# Patient Record
Sex: Female | Born: 1985 | Race: White | Hispanic: No | Marital: Married | Smoking: Never smoker
Health system: Southern US, Community
[De-identification: ages and names within clinical notes are randomized; demographics above are authoritative.]

## PROBLEM LIST (undated history)

## (undated) DIAGNOSIS — Z789 Other specified health status: Secondary | ICD-10-CM

## (undated) HISTORY — PX: CHOLECYSTECTOMY: SHX55

## (undated) HISTORY — PX: TONSILLECTOMY: SUR1361

---

## 2017-07-30 ENCOUNTER — Encounter: Payer: Self-pay | Admitting: Emergency Medicine

## 2017-07-30 ENCOUNTER — Ambulatory Visit (INDEPENDENT_AMBULATORY_CARE_PROVIDER_SITE_OTHER): Payer: BC Managed Care – PPO

## 2017-07-30 ENCOUNTER — Inpatient Hospital Stay
Admission: EM | Admit: 2017-07-30 | Discharge: 2017-08-03 | DRG: 871 | Disposition: A | Discharge status: Home / Self Care | Payer: BC Managed Care – PPO | Attending: Internal Medicine | Admitting: Internal Medicine

## 2017-07-30 ENCOUNTER — Ambulatory Visit (INDEPENDENT_AMBULATORY_CARE_PROVIDER_SITE_OTHER)
Admission: EM | Admit: 2017-07-30 | Discharge: 2017-07-30 | Disposition: A | Discharge status: Other Acute Inpatient Hospital | Payer: BC Managed Care – PPO | Source: Home / Self Care | Attending: Family Medicine | Admitting: Family Medicine

## 2017-07-30 DIAGNOSIS — Z836 Family history of other diseases of the respiratory system: Secondary | ICD-10-CM

## 2017-07-30 DIAGNOSIS — Z8709 Personal history of other diseases of the respiratory system: Secondary | ICD-10-CM

## 2017-07-30 DIAGNOSIS — J181 Lobar pneumonia, unspecified organism: Secondary | ICD-10-CM | POA: Diagnosis present

## 2017-07-30 DIAGNOSIS — J9601 Acute respiratory failure with hypoxia: Secondary | ICD-10-CM | POA: Diagnosis present

## 2017-07-30 DIAGNOSIS — A419 Sepsis, unspecified organism: Principal | ICD-10-CM

## 2017-07-30 DIAGNOSIS — Z9049 Acquired absence of other specified parts of digestive tract: Secondary | ICD-10-CM | POA: Diagnosis not present

## 2017-07-30 DIAGNOSIS — J189 Pneumonia, unspecified organism: Secondary | ICD-10-CM

## 2017-07-30 DIAGNOSIS — R05 Cough: Secondary | ICD-10-CM | POA: Diagnosis not present

## 2017-07-30 DIAGNOSIS — R0789 Other chest pain: Secondary | ICD-10-CM

## 2017-07-30 DIAGNOSIS — R6520 Severe sepsis without septic shock: Secondary | ICD-10-CM | POA: Diagnosis present

## 2017-07-30 DIAGNOSIS — E872 Acidosis: Secondary | ICD-10-CM | POA: Diagnosis present

## 2017-07-30 DIAGNOSIS — E876 Hypokalemia: Secondary | ICD-10-CM | POA: Diagnosis present

## 2017-07-30 HISTORY — DX: Other specified health status: Z78.9

## 2017-07-30 LAB — CBC WITH DIFFERENTIAL/PLATELET
BASOS ABS: 0 10*3/uL (ref 0–0.1)
Basophils Absolute: 0 10*3/uL (ref 0–0.1)
Basophils Relative: 0 %
Basophils Relative: 0 %
Eosinophils Absolute: 0 10*3/uL (ref 0–0.7)
Eosinophils Absolute: 0 10*3/uL (ref 0–0.7)
Eosinophils Relative: 0 %
Eosinophils Relative: 0 %
HEMATOCRIT: 39.8 % (ref 35.0–47.0)
HEMATOCRIT: 37.1 % (ref 35.0–47.0)
HEMOGLOBIN: 13.2 g/dL (ref 12.0–16.0)
HEMOGLOBIN: 12.7 g/dL (ref 12.0–16.0)
LYMPHS PCT: 2 %
LYMPHS PCT: 3 %
Lymphs Abs: 0.3 10*3/uL — ABNORMAL LOW (ref 1.0–3.6)
Lymphs Abs: 0.5 10*3/uL — ABNORMAL LOW (ref 1.0–3.6)
MCH: 29 pg (ref 26.0–34.0)
MCH: 30 pg (ref 26.0–34.0)
MCHC: 33.1 g/dL (ref 32.0–36.0)
MCHC: 34.3 g/dL (ref 32.0–36.0)
MCV: 87.8 fL (ref 80.0–100.0)
MCV: 87.5 fL (ref 80.0–100.0)
MONO ABS: 0.5 10*3/uL (ref 0.2–0.9)
MONO ABS: 0.6 10*3/uL (ref 0.2–0.9)
MONOS PCT: 3 %
Monocytes Relative: 3 %
NEUTROS ABS: 17.8 10*3/uL — AB (ref 1.4–6.5)
NEUTROS ABS: 18.4 10*3/uL — AB (ref 1.4–6.5)
NEUTROS PCT: 95 %
NEUTROS PCT: 94 %
Platelets: 219 10*3/uL (ref 150–440)
Platelets: 215 10*3/uL (ref 150–440)
RBC: 4.53 MIL/uL (ref 3.80–5.20)
RBC: 4.24 MIL/uL (ref 3.80–5.20)
RDW: 12.9 % (ref 11.5–14.5)
RDW: 13.3 % (ref 11.5–14.5)
WBC: 18.7 10*3/uL — AB (ref 3.6–11.0)
WBC: 19.5 10*3/uL — ABNORMAL HIGH (ref 3.6–11.0)

## 2017-07-30 LAB — BASIC METABOLIC PANEL
ANION GAP: 11 (ref 5–15)
ANION GAP: 11 (ref 5–15)
BUN: 13 mg/dL (ref 6–20)
BUN: 9 mg/dL (ref 6–20)
CALCIUM: 8.5 mg/dL — AB (ref 8.9–10.3)
CHLORIDE: 100 mmol/L — AB (ref 101–111)
CO2: 22 mmol/L (ref 22–32)
CO2: 21 mmol/L — ABNORMAL LOW (ref 22–32)
CREATININE: 0.74 mg/dL (ref 0.44–1.00)
Calcium: 9 mg/dL (ref 8.9–10.3)
Chloride: 104 mmol/L (ref 101–111)
Creatinine, Ser: 0.7 mg/dL (ref 0.44–1.00)
GFR calc non Af Amer: >60 mL/min (ref >60)
GFR calc non Af Amer: >60 mL/min (ref >60)
GLUCOSE: 151 mg/dL — AB (ref 65–99)
Glucose, Bld: 158 mg/dL — ABNORMAL HIGH (ref 65–99)
POTASSIUM: 3.9 mmol/L (ref 3.5–5.1)
Potassium: 2.8 mmol/L — ABNORMAL LOW (ref 3.5–5.1)
SODIUM: 136 mmol/L (ref 135–145)
Sodium: 133 mmol/L — ABNORMAL LOW (ref 135–145)

## 2017-07-30 LAB — MAGNESIUM: Magnesium: 1.7 mg/dL (ref 1.7–2.4)

## 2017-07-30 LAB — STREP PNEUMONIAE URINARY ANTIGEN: STREP PNEUMO URINARY ANTIGEN: NEGATIVE

## 2017-07-30 LAB — LACTIC ACID, PLASMA
LACTIC ACID, VENOUS: 4.2 mmol/L — AB (ref 0.5–1.9)
LACTIC ACID, VENOUS: 2.9 mmol/L — AB (ref 0.5–1.9)
Lactic Acid, Venous: 2.3 mmol/L (ref 0.5–1.9)

## 2017-07-30 MED ORDER — BENZONATATE 100 MG PO CAPS: 100.0000 mg | ORAL_CAPSULE | Freq: Three times a day (TID) | ORAL | 0 refills | Status: DC | PRN

## 2017-07-30 MED ORDER — POTASSIUM CHLORIDE IN NACL 40-0.9 MEQ/L-% IV SOLN
INTRAVENOUS | Status: AC
  Administered 2017-07-30: 125 mL/h via INTRAVENOUS
  Filled 2017-07-30: qty 1000

## 2017-07-30 MED ORDER — ONDANSETRON HCL 4 MG/2ML IJ SOLN
4.0000 mg | Freq: Once | INTRAMUSCULAR | Status: AC
  Administered 2017-07-30: 4 mg via INTRAVENOUS
  Filled 2017-07-30: qty 2

## 2017-07-30 MED ORDER — BENZONATATE 100 MG PO CAPS
100.0000 mg | ORAL_CAPSULE | Freq: Three times a day (TID) | ORAL | Status: DC | PRN
  Administered 2017-07-31 – 2017-08-03 (×8): 100 mg via ORAL
  Filled 2017-07-30 (×8): qty 1

## 2017-07-30 MED ORDER — CEFTRIAXONE SODIUM IN DEXTROSE 20 MG/ML IV SOLN
1.0000 g | Freq: Once | INTRAVENOUS | Status: AC
  Administered 2017-07-30: 1 g via INTRAVENOUS
  Filled 2017-07-30: qty 50

## 2017-07-30 MED ORDER — DOCUSATE SODIUM 100 MG PO CAPS: 100.0000 mg | ORAL_CAPSULE | Freq: Two times a day (BID) | ORAL | Status: DC | PRN

## 2017-07-30 MED ORDER — AZITHROMYCIN 500 MG PO TABS
500.0000 mg | ORAL_TABLET | Freq: Once | ORAL | Status: AC
  Administered 2017-07-30: 500 mg via ORAL

## 2017-07-30 MED ORDER — PROMETHAZINE HCL 25 MG/ML IJ SOLN
25.0000 mg | Freq: Four times a day (QID) | INTRAMUSCULAR | Status: DC | PRN
  Administered 2017-07-30: 25 mg via INTRAVENOUS
  Filled 2017-07-30: qty 1

## 2017-07-30 MED ORDER — SODIUM CHLORIDE 0.9 % IV BOLUS (SEPSIS)
1000.0000 mL | Freq: Once | INTRAVENOUS | Status: AC
  Administered 2017-07-30: 1000 mL via INTRAVENOUS

## 2017-07-30 MED ORDER — CEFTRIAXONE SODIUM IN DEXTROSE 20 MG/ML IV SOLN
1.0000 g | INTRAVENOUS | Status: DC
  Filled 2017-07-30: qty 50

## 2017-07-30 MED ORDER — IBUPROFEN 400 MG PO TABS
200.0000 mg | ORAL_TABLET | Freq: Four times a day (QID) | ORAL | Status: DC | PRN
  Administered 2017-07-30 – 2017-08-01 (×3): 200 mg via ORAL
  Filled 2017-07-30 (×3): qty 1

## 2017-07-30 MED ORDER — AZITHROMYCIN 250 MG PO TABS: ORAL_TABLET | ORAL | 0 refills | Status: DC

## 2017-07-30 MED ORDER — POTASSIUM CHLORIDE 20 MEQ PO PACK
40.0000 meq | PACK | Freq: Once | ORAL | Status: AC
  Administered 2017-07-30: 40 meq via ORAL
  Filled 2017-07-30: qty 2

## 2017-07-30 MED ORDER — IPRATROPIUM-ALBUTEROL 0.5-2.5 (3) MG/3ML IN SOLN
3.0000 mL | Freq: Once | RESPIRATORY_TRACT | Status: AC
  Administered 2017-07-30: 3 mL via RESPIRATORY_TRACT

## 2017-07-30 MED ORDER — ACETAMINOPHEN 325 MG PO TABS
650.0000 mg | ORAL_TABLET | Freq: Four times a day (QID) | ORAL | Status: DC | PRN
  Administered 2017-07-30 – 2017-08-02 (×3): 650 mg via ORAL
  Filled 2017-07-30 (×3): qty 2

## 2017-07-30 MED ORDER — ACETAMINOPHEN 500 MG PO TABS
1000.0000 mg | ORAL_TABLET | Freq: Once | ORAL | Status: AC
  Administered 2017-07-30: 1000 mg via ORAL

## 2017-07-30 MED ORDER — GUAIFENESIN-CODEINE 100-10 MG/5ML PO SOLN
10.0000 mL | Freq: Four times a day (QID) | ORAL | Status: DC | PRN
  Administered 2017-08-01 – 2017-08-03 (×7): 10 mL via ORAL
  Filled 2017-07-30 (×7): qty 10

## 2017-07-30 MED ORDER — CEFTRIAXONE SODIUM 1 G IJ SOLR
1.0000 g | Freq: Once | INTRAMUSCULAR | Status: AC
  Administered 2017-07-30: 1 g via INTRAMUSCULAR

## 2017-07-30 MED ORDER — POTASSIUM CHLORIDE CRYS ER 20 MEQ PO TBCR
40.0000 meq | EXTENDED_RELEASE_TABLET | Freq: Two times a day (BID) | ORAL | Status: DC
  Filled 2017-07-30: qty 2

## 2017-07-30 MED ORDER — SODIUM CHLORIDE 0.9 % IV SOLN
INTRAVENOUS | Status: DC
  Administered 2017-07-30: 16:00:00 via INTRAVENOUS

## 2017-07-30 MED ORDER — DEXTROSE 5 % IV SOLN
1.0000 g | INTRAVENOUS | Status: DC
  Administered 2017-07-31 – 2017-08-03 (×4): 1 g via INTRAVENOUS
  Filled 2017-07-30 (×4): qty 10

## 2017-07-30 MED ORDER — DEXTROSE 5 % IV SOLN
250.0000 mg | INTRAVENOUS | Status: DC
  Administered 2017-07-31 – 2017-08-01 (×2): 250 mg via INTRAVENOUS
  Filled 2017-07-30 (×3): qty 250

## 2017-07-30 NOTE — Progress Notes (Signed)
Pt is unable to swallow PO Klor Con, MD Vachhani notified. MD placing orders.

## 2017-07-30 NOTE — Progress Notes (Signed)
ADMISSION NOTE:  Pt arrived to room 146 from ED. Pt alert and oriented X4. Skin assessment completed and tele monitor applied. Navigator completed. Pt oriented to room and staff. Pts HR elevated (MD Vachanni aware).  Pts bed in lowest position and call bell in reach.

## 2017-07-30 NOTE — Progress Notes (Signed)
Received report from ER nurse, Pts potasium 2.8. MD Esaw Grandchild notified. MD placing orders.

## 2017-07-30 NOTE — ED Triage Notes (Signed)
Patient c/o cough, chest congestion, and chest tightness since Monday.

## 2017-07-30 NOTE — ED Triage Notes (Signed)
Seen at [facility] for cough, diagnosed with pneumonia and sent here.
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
## 2017-07-30 NOTE — Discharge Instructions (Signed)
Recommend go to Emergency Department

## 2017-07-30 NOTE — H&P (Addendum)
[facility] - [place] at [facility]   PATIENT NAME: Amanda Torres    MR#:  [id number]  DATE OF BIRTH:  1986-02-18  DATE OF ADMISSION:  07/30/2017  PRIMARY CARE PHYSICIAN: Titus Mould, NP   REQUESTING/REFERRING PHYSICIAN: Quale  CHIEF COMPLAINT:   Chief Complaint  Patient presents with  . Shortness of Breath    HISTORY OF PRESENT ILLNESS: Amanda Torres  is a 31 y.o. 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 Given as admission due to sepsis.  PAST MEDICAL HISTORY:   Past Medical History:  Diagnosis Date  . Medical history non-contributory     PAST SURGICAL HISTORY: Past Surgical History:  Procedure Laterality Date  . CHOLECYSTECTOMY    . TONSILLECTOMY      SOCIAL HISTORY:  Social History  Substance Use Topics  . Smoking status: Never Smoker  . Smokeless tobacco: Never Used  . Alcohol use No    FAMILY HISTORY:  Family History  Problem Relation Age of Onset  . Lung disease Mother     DRUG ALLERGIES: No Known Allergies  REVIEW OF SYSTEMS:   CONSTITUTIONAL: No fever, positive for fatigue or weakness.  EYES: No blurred or double vision.  EARS, NOSE, AND THROAT: No tinnitus or ear pain.  RESPIRATORY: positive for cough, shortness of breath,no wheezing or hemoptysis.  CARDIOVASCULAR: positive for chest pain,no orthopnea, edema.  GASTROINTESTINAL: No nausea, vomiting, diarrhea or abdominal pain.  GENITOURINARY: No dysuria, hematuria.  ENDOCRINE: No polyuria, nocturia,  HEMATOLOGY: No anemia, easy bruising or bleeding SKIN: No rash or lesion. MUSCULOSKELETAL: No joint pain or arthritis.   NEUROLOGIC: No tingling, numbness, weakness.  PSYCHIATRY: No anxiety or depression.   MEDICATIONS AT HOME:  Prior to Admission  medications   Medication Sig Start Date End Date Taking? Authorizing Provider  acetaminophen (TYLENOL) 325 MG tablet Take 650 mg by mouth every 6 (six) hours as needed.   Yes [provider]  azithromycin (ZITHROMAX Z-PAK) 250 MG tablet 2 tabs po once day 1, then 1 tab po qd for next 4 days 07/30/17  Yes Payton Mccallum, MD  ibuprofen (ADVIL,MOTRIN) 100 MG tablet Take 100 mg by mouth every 6 (six) hours as needed for fever.   Yes [provider]  benzonatate (TESSALON) 100 MG capsule Take 1 capsule (100 mg total) by mouth 3 (three) times daily as needed. Patient not taking: Reported on 07/30/2017 07/30/17   Payton Mccallum, MD      PHYSICAL EXAMINATION:   VITAL SIGNS: Blood pressure (!) 121/42, pulse (!) 125, temperature 98.7 F (37.1 C), temperature source Oral, resp. rate (!) 25, height  (1.549 m), weight 52.6 kg (116 lb), last menstrual period 07/07/2017, SpO2 96 %.  GENERAL:  31 y.o.-year-old patient lying in the bed with no acute distress.  EYES: Pupils equal, round, reactive to light and accommodation. No scleral icterus. Extraocular muscles intact.  HEENT: Head atraumatic, normocephalic. Oropharynx and nasopharynx clear.  NECK:  Supple, no jugular venous distention. No thyroid enlargement, no tenderness.  LUNGS: Normal breath sounds bilaterally, no wheezing, have crepitation. No use of accessory muscles of respiration.  CARDIOVASCULAR: S1, S2 normal and fast, No murmurs, rubs, or gallops.  ABDOMEN: Soft, nontender, nondistended. Bowel sounds present. No organomegaly or mass.  EXTREMITIES: No pedal edema,  cyanosis, or clubbing.  NEUROLOGIC: Cranial nerves II through XII are intact. Muscle strength 5/5 in all extremities. Sensation intact. Gait not checked.  PSYCHIATRIC: The patient is alert and oriented x 3.  SKIN: No obvious rash, lesion, or ulcer.   LABORATORY PANEL:   CBC  Recent Labs Lab 07/30/17 0840 07/30/17 1312  WBC 18.7* 19.5*  HGB 13.2 12.7  HCT  39.8 37.1  PLT 219 215  MCV 87.8 87.5  MCH 29.0 30.0  MCHC 33.1 34.3  RDW 12.9 13.3  LYMPHSABS 0.3* 0.5*  MONOABS 0.5 0.6  EOSABS 0.0 0.0  BASOSABS 0.0 0.0   ------------------------------------------------------------------------------------------------------------------  Chemistries   Recent Labs Lab 07/30/17 0840 07/30/17 1312  NA 133* 136  K 3.9 2.8*  CL 100* 104  CO2 22 21*  GLUCOSE 151* 158*  BUN 13 9  CREATININE 0.70 0.74  CALCIUM 9.0 8.5*   ------------------------------------------------------------------------------------------------------------------ estimated creatinine clearance is 76.9 mL/min (by C-G formula based on SCr of 0.74 mg/dL). ------------------------------------------------------------------------------------------------------------------ No results for input(s): TSH, T4TOTAL, T3FREE, THYROIDAB in the last 72 hours.  Invalid input(s): FREET3   Coagulation profile No results for input(s): INR, PROTIME in the last 168 hours. ------------------------------------------------------------------------------------------------------------------- No results for input(s): DDIMER in the last 72 hours. -------------------------------------------------------------------------------------------------------------------  Cardiac Enzymes No results for input(s): CKMB, TROPONINI, MYOGLOBIN in the last 168 hours.  Invalid input(s): CK ------------------------------------------------------------------------------------------------------------------ Invalid input(s): POCBNP  ---------------------------------------------------------------------------------------------------------------  Urinalysis No results found for: COLORURINE, APPEARANCEUR, LABSPEC, PHURINE, GLUCOSEU, HGBUR, BILIRUBINUR, KETONESUR, PROTEINUR, UROBILINOGEN, NITRITE, LEUKOCYTESUR   RADIOLOGY: Dg Chest 2 View  Result Date: 07/30/2017 CLINICAL DATA:  Nonproductive cough, congestion, fever  and shortness of breath over the last week. EXAM: CHEST  2 VIEW COMPARISON:  None. FINDINGS: Heart size is normal. Mediastinal shadows are normal. There is left lower lobe pneumonia. There may be mild patchy pneumonia at the right lung base. Linear density in the right mid lung probably represents scar. Follow-up on subsequent radiographs. IMPRESSION: Lower lobe pneumonia left worse than right. Electronically Signed   By: Paulina Fusi M.D.   On: 07/30/2017 09:04    EKG: No orders found for this or any previous visit.  IMPRESSION AND PLAN:  * Sepsis    Pneumonia   Lactic acidosis    IV rocephin, IV azithromycin   Sputum and Blood cx   IV fluids   Lactic acid to follow.  * hypokalemia   Replace oral, check Mg.  All the records are reviewed and case discussed with ED provider. Management plans discussed with the patient, family and they are in agreement.  CODE STATUS: full. Code Status History    This patient does not have a recorded code status. Please follow your organizational policy for patients in this situation.       TOTAL TIME TAKING CARE OF THIS PATIENT: 45 minutes.    Altamese Dilling M.D on 07/30/2017   Between 7am to 6pm - Pager - [phone]  After 6pm go to [email or web address] - Social research officer, government  [facility]  Office  [phone]  CC: Primary care physician; White, Arlyss Repress, NP   Note: This dictation was prepared with Dragon dictation along with smaller phrase technology. Any transcriptional errors that result from this process are unintentional.

## 2017-07-30 NOTE — ED Provider Notes (Signed)
[facility]    CSN: [id number] Arrival date & time: 07/30/17  7829     History   Chief Complaint Chief Complaint  Patient presents with  . Cough    HPI Dilana Hink is a 31 y.o. female.   The history is provided by the patient.  Cough  Associated symptoms: fever   Associated symptoms: no headaches and no wheezing   URI  Presenting symptoms: cough and fever   Severity:  Moderate Onset quality:  Sudden Duration:  6 days Timing:  Constant Progression:  Worsening Chronicity:  New Relieved by:  Nothing Ineffective treatments:  OTC medications Associated symptoms: no headaches and no wheezing   Risk factors: not elderly, no chronic cardiac disease, no chronic kidney disease, no chronic respiratory disease, no diabetes mellitus, no immunosuppression, no recent illness, no recent travel and no sick contacts     History reviewed. No pertinent past medical history.  There are no active problems to display for this patient.   Past Surgical History:  Procedure Laterality Date  . CHOLECYSTECTOMY    . TONSILLECTOMY      OB History    No data available       Home Medications    Prior to Admission medications   Medication Sig Start Date End Date Taking? Authorizing Provider  azithromycin (ZITHROMAX Z-PAK) 250 MG tablet 2 tabs po once day 1, then 1 tab po qd for next 4 days 07/30/17   Payton Mccallum, MD  benzonatate (TESSALON) 100 MG capsule Take 1 capsule (100 mg total) by mouth 3 (three) times daily as needed. 07/30/17   Payton Mccallum, MD    Family History History reviewed. No pertinent family history.  Social History Social History  Substance Use Topics  . Smoking status: Never Smoker  . Smokeless tobacco: Never Used  . Alcohol use No     Allergies   Patient has no known allergies.   Review of Systems Review of Systems  Constitutional: Positive for fever.  Respiratory: Positive for cough. Negative for wheezing.   Neurological:  Negative for headaches.     Physical Exam Triage Vital Signs ED Triage Vitals  Enc Vitals Group     BP 07/30/17 0835 104/62     Pulse Rate 07/30/17 0835 (S) (!) 130     Resp 07/30/17 0835 17     Temp 07/30/17 0835 (S) (!) 103 F (39.4 C)     Temp Source 07/30/17 0835 Oral     SpO2 07/30/17 0835 (S) 94 %     Weight 07/30/17 0833 160 lb (72.6 kg)     Height 07/30/17 0833  (1.549 m)     Head Circumference --      Peak Flow --      Pain Score 07/30/17 0833 0     Pain Loc --      Pain Edu? --      Excl. in GC? --    No data found.   Updated Vital Signs BP (!) 104/51 (BP Location: Right Arm)   Pulse (!) 120   Temp 99.5 F (37.5 C) (Oral)   Resp 16   Ht  (1.549 m)   Wt 160 lb (72.6 kg)   LMP 07/07/2017 (Exact Date) Comment: denies preg  SpO2 93%   BMI 30.23 kg/m   Visual Acuity Right Eye Distance:   Left Eye Distance:   Bilateral Distance:    Right Eye Near:   Left Eye Near:    Bilateral  Near:     Physical Exam  Constitutional: She appears well-developed and well-nourished. No distress.  HENT:  Head: Normocephalic and atraumatic.  Right Ear: External ear normal.  Left Ear: External ear normal.  Nose: No mucosal edema, rhinorrhea, nose lacerations, sinus tenderness, nasal deformity, septal deviation or nasal septal hematoma. No epistaxis.  No foreign bodies. Right sinus exhibits no maxillary sinus tenderness and no frontal sinus tenderness. Left sinus exhibits no maxillary sinus tenderness and no frontal sinus tenderness.  Mouth/Throat: Uvula is midline, oropharynx is clear and moist and mucous membranes are normal. No oropharyngeal exudate.  Eyes: Pupils are equal, round, and reactive to light. Conjunctivae and EOM are normal. Right eye exhibits no discharge. Left eye exhibits no discharge. No scleral icterus.  Neck: Normal range of motion. Neck supple. No thyromegaly present.  Cardiovascular: Normal rate, regular rhythm and normal heart sounds.     Pulmonary/Chest: Effort normal and breath sounds normal. No respiratory distress. She has no wheezes. She has no rales.  Lymphadenopathy:    She has no cervical adenopathy.  Skin: She is not diaphoretic.  Nursing note and vitals reviewed.    UC Treatments / Results  Labs (all labs ordered are listed, but only abnormal results are displayed) Labs Reviewed  CBC WITH DIFFERENTIAL/PLATELET - Abnormal; Notable for the following:       Result Value   WBC 18.7 (*)    Neutro Abs 17.8 (*)    Lymphs Abs 0.3 (*)    All other components within normal limits  BASIC METABOLIC PANEL - Abnormal; Notable for the following:    Sodium 133 (*)    Chloride 100 (*)    Glucose, Bld 151 (*)    All other components within normal limits    EKG  EKG Interpretation None       Radiology Dg Chest 2 View  Result Date: 07/30/2017 CLINICAL DATA:  Nonproductive cough, congestion, fever and shortness of breath over the last week. EXAM: CHEST  2 VIEW COMPARISON:  None. FINDINGS: Heart size is normal. Mediastinal shadows are normal. There is left lower lobe pneumonia. There may be mild patchy pneumonia at the right lung base. Linear density in the right mid lung probably represents scar. Follow-up on subsequent radiographs. IMPRESSION: Lower lobe pneumonia left worse than right. Electronically Signed   By: Paulina Fusi M.D.   On: 07/30/2017 09:04    Procedures Procedures (including critical care time)  Medications Ordered in UC Medications  acetaminophen (TYLENOL) tablet 1,000 mg (1,000 mg Oral Given 07/30/17 0851)  cefTRIAXone (ROCEPHIN) injection 1 g (1 g Intramuscular Given 07/30/17 0919)  ipratropium-albuterol (DUONEB) 0.5-2.5 (3) MG/3ML nebulizer solution 3 mL (3 mLs Nebulization Given 07/30/17 0951)  sodium chloride 0.9 % bolus 1,000 mL (0 mLs Intravenous Stopped 07/30/17 1140)  ipratropium-albuterol (DUONEB) 0.5-2.5 (3) MG/3ML nebulizer solution 3 mL (3 mLs Nebulization Given 07/30/17 1037)   azithromycin (ZITHROMAX) tablet 500 mg (500 mg Oral Given 07/30/17 1114)     Initial Impression / Assessment and Plan / UC Course  I have reviewed the triage vital signs and the nursing notes.  Pertinent labs & imaging results that were available during my care of the patient were reviewed by me and considered in my medical decision making (see chart for details).       Final Clinical Impressions(s) / UC Diagnoses   Final diagnoses:  Community acquired pneumonia of left lower lobe of lung (HCC)  Community acquired pneumonia of right lower lobe of lung (HCC)  New Prescriptions New Prescriptions   AZITHROMYCIN (ZITHROMAX Z-PAK) 250 MG TABLET    2 tabs po once day 1, then 1 tab po qd for next 4 days   BENZONATATE (TESSALON) 100 MG CAPSULE    Take 1 capsule (100 mg total) by mouth 3 (three) times daily as needed.   1. Labs/x-ray results and diagnosis reviewed with patient 2. Patient given Rocephin 1gm IM x1, zithromax  po x1 and 1L of NS 3. duoneb x 2  4. O2 sat 92% at rest, however with ambulation dropped to 87% and patient remained tachycardic after above treatments 5. Recommend patient go to Emergency Department for further evaluation/management; possible hospital admission   Controlled Substance Prescriptions [place] Controlled Substance Registry consulted? Not Applicable   Payton Mccallum, MD 07/30/17 1149

## 2017-07-30 NOTE — ED Notes (Signed)
Ambulatory O2 sat 86% and HR 130

## 2017-07-31 ENCOUNTER — Inpatient Hospital Stay: Payer: BC Managed Care – PPO

## 2017-07-31 LAB — BASIC METABOLIC PANEL
ANION GAP: 3 — AB (ref 5–15)
BUN: 7 mg/dL (ref 6–20)
CHLORIDE: 114 mmol/L — AB (ref 101–111)
CO2: 23 mmol/L (ref 22–32)
Calcium: 8.3 mg/dL — ABNORMAL LOW (ref 8.9–10.3)
Creatinine, Ser: 0.53 mg/dL (ref 0.44–1.00)
Glucose, Bld: 107 mg/dL — ABNORMAL HIGH (ref 65–99)
POTASSIUM: 4.2 mmol/L (ref 3.5–5.1)
SODIUM: 140 mmol/L (ref 135–145)

## 2017-07-31 LAB — CBC
HEMATOCRIT: 34 % — AB (ref 35.0–47.0)
Hemoglobin: 11.6 g/dL — ABNORMAL LOW (ref 12.0–16.0)
MCH: 29.6 pg (ref 26.0–34.0)
MCHC: 34 g/dL (ref 32.0–36.0)
MCV: 86.8 fL (ref 80.0–100.0)
Platelets: 189 10*3/uL (ref 150–440)
RBC: 3.92 MIL/uL (ref 3.80–5.20)
RDW: 13.3 % (ref 11.5–14.5)
WBC: 14.6 10*3/uL — AB (ref 3.6–11.0)

## 2017-07-31 MED ORDER — GUAIFENESIN-CODEINE 100-10 MG/5ML PO SOLN: 10.0000 mL | Freq: Four times a day (QID) | ORAL | 0 refills | Status: DC | PRN

## 2017-07-31 MED ORDER — SODIUM CHLORIDE 0.9 % IV BOLUS (SEPSIS)
500.0000 mL | Freq: Once | INTRAVENOUS | Status: AC
  Administered 2017-07-31: 500 mL via INTRAVENOUS

## 2017-07-31 MED ORDER — LEVOFLOXACIN 750 MG PO TABS
750.0000 mg | ORAL_TABLET | Freq: Every day | ORAL | 0 refills | Status: DC
Start: 2017-07-31 — End: 2017-08-03

## 2017-07-31 MED ORDER — GUAIFENESIN 100 MG/5ML PO SOLN
5.0000 mL | ORAL | Status: DC | PRN
  Administered 2017-07-31 – 2017-08-01 (×3): 100 mg via ORAL
  Filled 2017-07-31 (×4): qty 5

## 2017-07-31 NOTE — Progress Notes (Signed)
[place] [facility] [place] NAME: Amanda Torres    MR#:  [id number]  DATE OF BIRTH:  11-21-1985  SUBJECTIVE:   Patient here due to fever, cough, shortness of breath and noted to have pneumonia. Repeat CXR today b/l infiltrates with possible left sided effusion.  Still has significant shortness of breath on minimal exertion.   REVIEW OF SYSTEMS:    Review of Systems  Constitutional: Negative for chills and fever.  HENT: Negative for congestion and tinnitus.   Eyes: Negative for blurred vision and double vision.  Respiratory: Positive for cough, sputum production and shortness of breath. Negative for wheezing.   Cardiovascular: Negative for chest pain, orthopnea and PND.  Gastrointestinal: Negative for abdominal pain, diarrhea, nausea and vomiting.  Genitourinary: Negative for dysuria and hematuria.  Neurological: Negative for dizziness, sensory change and focal weakness.  All other systems reviewed and are negative.   Nutrition: Regular Tolerating Diet: Yes Tolerating PT: Ambulatory   DRUG ALLERGIES:  No Known Allergies  VITALS:  Blood pressure (!) 91/54, pulse (!) 102, temperature 99 F (37.2 C), temperature source Oral, resp. rate 18, height 5' 1"  (1.549 m), weight 52.6 kg (116 lb), last menstrual period 07/07/2017, SpO2 93 %.  PHYSICAL EXAMINATION:   Physical Exam  GENERAL:  31 y.o.-year-old patient lying in bed in mild Resp. Distress.  EYES: Pupils equal, round, reactive to light and accommodation. No scleral icterus. Extraocular muscles intact.  HEENT: Head atraumatic, normocephalic. Oropharynx and nasopharynx clear.  NECK:  Supple, no jugular venous distention. No thyroid enlargement, no tenderness.  LUNGS: Poor Resp. Effort, no wheezing, rhonchi, b/l rales. No use of accessory muscles of respiration.  CARDIOVASCULAR: S1, S2 normal. No murmurs, rubs, or gallops.  ABDOMEN: Soft, nontender, nondistended. Bowel sounds present. No  organomegaly or mass.  EXTREMITIES: No cyanosis, clubbing or edema b/l.    NEUROLOGIC: Cranial nerves II through XII are intact. No focal Motor or sensory deficits b/l.   PSYCHIATRIC: The patient is alert and oriented x 3.  SKIN: No obvious rash, lesion, or ulcer.    LABORATORY PANEL:   CBC  Recent Labs Lab 07/31/17 0355  WBC 14.6*  HGB 11.6*  HCT 34.0*  PLT 189   ------------------------------------------------------------------------------------------------------------------  Chemistries   Recent Labs Lab 07/30/17 1312 07/31/17 0355  NA 136 140  K 2.8* 4.2  CL 104 114*  CO2 21* 23  GLUCOSE 158* 107*  BUN 9 7  CREATININE 0.74 0.53  CALCIUM 8.5* 8.3*  MG 1.7  --    ------------------------------------------------------------------------------------------------------------------  Cardiac Enzymes No results for input(s): TROPONINI in the last 168 hours. ------------------------------------------------------------------------------------------------------------------  RADIOLOGY:  Dg Chest 2 View  Result Date: 07/31/2017 CLINICAL DATA:  Pneumonia and shortness of breath. EXAM: CHEST  2 VIEW COMPARISON:  07/30/2017 FINDINGS: Radiographic worsening with developing pleural effusions an worsening consolidation in both lower lobes. Patchy infiltrate also in the right mid lung. IMPRESSION: Radiographic worsening with developing effusion an worsening bilateral infiltrates left worse than right. Electronically Signed   By: Nelson Chimes M.D.   On: 07/31/2017 09:53   Dg Chest 2 View  Result Date: 07/30/2017 CLINICAL DATA:  Nonproductive cough, congestion, fever and shortness of breath over the last week. EXAM: CHEST  2 VIEW COMPARISON:  None. FINDINGS: Heart size is normal. Mediastinal shadows are normal. There is left lower lobe pneumonia. There may be mild patchy pneumonia at the right lung base. Linear density in the right mid lung probably represents scar. Follow-up on  subsequent radiographs. IMPRESSION: Lower lobe pneumonia left worse than right. Electronically Signed   By: Nelson Chimes M.D.   On: 07/30/2017 09:04     ASSESSMENT AND PLAN:   31 year old female with no significant past medical history presented to the hospital due to fever, cough, shortness of breath and noted to have a pneumonia.  1. Sepsis-patient met criteria admission given her fever, leukocytosis, elevated lactic acid and chest x-ray findings suggestive of pneumonia. -Continue IV ceftriaxone, Zithromax. Follow cultures which were negative. Continue IV fluids and supportive care for now.  2. Acute respiratory failure with hypoxia-secondary to pneumonia. - Continue O2 supplementation, continue IV antibiotics as mentioned above the pneumonia.  3. Pneumonia - cause of pt's sepsis/shortness of breath.  - CXR today  Showing b/l infiltrates and ?? Effusion.  - cont. IV Ceftriaxone, Zithromax for now. Consider getting CT chest tomorrow and if effusion getting worse consider getting thoracentesis.   4. Leukocytosis - due to # 3.  - follow w/ IV abx.   5. Hypokalemia - improved w/ supplementation and will monitor.     All the records are reviewed and case discussed with Care Management/Social Worker. Management plans discussed with the patient, family and they are in agreement.  CODE STATUS: Full code  DVT Prophylaxis: Ted's & SCD's.   TOTAL TIME TAKING CARE OF THIS PATIENT: 30 minutes.   POSSIBLE D/C IN 1-2 DAYS, DEPENDING ON CLINICAL CONDITION.   Henreitta Leber M.D on 07/31/2017 at 11:17 AM  Between 7am to 6pm - Pager - [phone]  After 6pm go to [email or web address] - [employer] [facility]  Office  [phone]  CC: Primary care physician; Ricardo Jericho, NP

## 2017-07-31 NOTE — Progress Notes (Signed)
Pts BP 88/50. MD Sainani notified. Orders received for 500 ml NS fluid bolus. Order placed.

## 2017-08-01 ENCOUNTER — Inpatient Hospital Stay: Payer: BC Managed Care – PPO

## 2017-08-01 LAB — CBC
HCT: 33.4 % — ABNORMAL LOW (ref 35.0–47.0)
Hemoglobin: 11.7 g/dL — ABNORMAL LOW (ref 12.0–16.0)
MCH: 30.5 pg (ref 26.0–34.0)
MCHC: 35 g/dL (ref 32.0–36.0)
MCV: 87.1 fL (ref 80.0–100.0)
PLATELETS: 225 10*3/uL (ref 150–440)
RBC: 3.84 MIL/uL (ref 3.80–5.20)
RDW: 13.2 % (ref 11.5–14.5)
WBC: 11.1 10*3/uL — ABNORMAL HIGH (ref 3.6–11.0)

## 2017-08-01 LAB — LEGIONELLA PNEUMOPHILA SEROGP 1 UR AG: L. PNEUMOPHILA SEROGP 1 UR AG: NEGATIVE

## 2017-08-01 MED ORDER — IPRATROPIUM-ALBUTEROL 0.5-2.5 (3) MG/3ML IN SOLN
3.0000 mL | Freq: Four times a day (QID) | RESPIRATORY_TRACT | Status: DC
  Administered 2017-08-01 – 2017-08-03 (×9): 3 mL via RESPIRATORY_TRACT
  Filled 2017-08-01 (×9): qty 3

## 2017-08-01 MED ORDER — METHYLPREDNISOLONE SODIUM SUCC 40 MG IJ SOLR
40.0000 mg | Freq: Every day | INTRAMUSCULAR | Status: DC
  Administered 2017-08-01 – 2017-08-03 (×3): 40 mg via INTRAVENOUS
  Filled 2017-08-01 (×3): qty 1

## 2017-08-01 MED ORDER — BUDESONIDE 0.5 MG/2ML IN SUSP
0.5000 mg | Freq: Two times a day (BID) | RESPIRATORY_TRACT | Status: DC
  Administered 2017-08-01 – 2017-08-03 (×4): 0.5 mg via RESPIRATORY_TRACT
  Filled 2017-08-01 (×4): qty 2

## 2017-08-01 NOTE — Progress Notes (Signed)
Patient ID: Amanda Torres, female   DOB: 1986/07/24, 31 y.o.   MRN: [id number]  [facility] PROGRESS NOTE  Amanda Torres WUJ:[id number] DOB: 13-Jul-1986 DOA: 07/30/2017 PCP: Titus Mould, NP  HPI/Subjective: Patient feeling short of breath with limited movement. Still with cough. Did not sleep very well last night.  Objective: Vitals:   08/01/17 0813 08/01/17 1300  BP: (!) 90/49   Pulse: 83   Resp:    Temp:    SpO2:  97%    Filed Weights   07/30/17 1302  Weight: 52.6 kg (116 lb)    ROS: Review of Systems  Constitutional: Negative for chills and fever.  Eyes: Negative for blurred vision.  Respiratory: Positive for cough and shortness of breath.   Cardiovascular: Negative for chest pain.  Gastrointestinal: Negative for abdominal pain, constipation, diarrhea, nausea and vomiting.  Genitourinary: Negative for dysuria.  Musculoskeletal: Negative for joint pain.  Neurological: Negative for dizziness and headaches.   Exam: Physical Exam  HENT:  Nose: No mucosal edema.  Mouth/Throat: No oropharyngeal exudate or posterior oropharyngeal edema.  Eyes: Pupils are equal, round, and reactive to light. Conjunctivae, EOM and lids are normal.  Neck: No JVD present. Carotid bruit is not present. No edema present. No thyroid mass and no thyromegaly present.  Cardiovascular: S1 normal and S2 normal.  Exam reveals no gallop.   No murmur heard. Pulses:      Dorsalis pedis pulses are 2+ on the right side, and 2+ on the left side.  Respiratory: No respiratory distress. She has decreased breath sounds in the right middle field, the right lower field, the left middle field and the left lower field. She has no wheezes. She has rhonchi in the right middle field, the right lower field, the left middle field and the left lower field. She has no rales.  GI: Soft. Bowel sounds are normal. There is no tenderness.  Musculoskeletal:       Right shoulder: She exhibits no swelling.   Lymphadenopathy:    She has no cervical adenopathy.  Neurological: She is alert. No cranial nerve deficit.  Skin: Skin is warm. No rash noted. Nails show no clubbing.  Psychiatric: She has a normal mood and affect.      Data Reviewed: Basic Metabolic Panel:  Recent Labs Lab 07/30/17 0840 07/30/17 1312 07/31/17 0355  NA 133* 136 140  K 3.9 2.8* 4.2  CL 100* 104 114*  CO2 22 21* 23  GLUCOSE 151* 158* 107*  BUN CREATININE 0.70 0.74 0.53  CALCIUM 9.0 8.5* 8.3*  MG  --  1.7  --    CBC:  Recent Labs Lab 07/30/17 0840 07/30/17 1312 07/31/17 0355 08/01/17 0327  WBC 18.7* 19.5* 14.6* 11.1*  NEUTROABS 17.8* 18.4*  --   --   HGB 13.2 12.7 11.6* 11.7*  HCT 39.8 37.1 34.0* 33.4*  MCV 87.8 87.5 86.8 87.1  PLT 219 215 189 225     Recent Results (from the past 240 hour(s))  Blood Culture (routine x 2)     Status: None (Preliminary result)   Collection Time: 07/30/17  1:12 PM  Result Value Ref Range Status   Specimen Description BLOOD BLOOD RIGHT HAND  Final   Special Requests   Final    BOTTLES DRAWN AEROBIC AND ANAEROBIC Blood Culture adequate volume   Culture NO GROWTH 2 DAYS  Final   Report Status PENDING  Incomplete  Blood Culture (routine x 2)     Status:  None (Preliminary result)   Collection Time: 07/30/17  1:17 PM  Result Value Ref Range Status   Specimen Description BLOOD LEFT ANTECUBITAL  Final   Special Requests   Final    BOTTLES DRAWN AEROBIC AND ANAEROBIC Blood Culture adequate volume   Culture NO GROWTH 2 DAYS  Final   Report Status PENDING  Incomplete     Studies: Dg Chest 2 View  Result Date: 07/31/2017 CLINICAL DATA:  Pneumonia and shortness of breath. EXAM: CHEST  2 VIEW COMPARISON:  07/30/2017 FINDINGS: Radiographic worsening with developing pleural effusions an worsening consolidation in both lower lobes. Patchy infiltrate also in the right mid lung. IMPRESSION: Radiographic worsening with developing effusion an worsening bilateral  infiltrates left worse than right. Electronically Signed   By: Paulina Fusi M.D.   On: 07/31/2017 09:53   Ct Chest Wo Contrast  Result Date: 08/01/2017 CLINICAL DATA:  Fever, cough, shortness breath, and pleural effusions. EXAM: CT CHEST WITHOUT CONTRAST TECHNIQUE: Multidetector CT imaging of the chest was performed following the standard protocol without IV contrast. COMPARISON:  Chest radiograph on 07/31/2017 FINDINGS: Cardiovascular:  No acute findings. Mediastinum/Nodes: No definite masses or pathologically enlarged lymph nodes identified on this unenhanced exam. Calcified mediastinal right hilar lymph nodes are consistent with old granulomatous disease. Lungs/Pleura: Bilateral lower lobe airspace disease is seen with central air bronchograms. There are also patchy areas airspace disease seen in the right upper and middle lobes and lingula. Small bilateral pleural effusions. Calcified granulomas noted in the right upper lobe. Upper Abdomen:  Unremarkable. Musculoskeletal:  No suspicious bone lesions. IMPRESSION: Bilateral pulmonary airspace disease, worst in the lower lobes, likely due to multilobar pneumonia. Continued radiographic followup recommended to confirm resolution. Small bilateral pleural effusions. Calcified old granulomatous disease. Electronically Signed   By: Myles Rosenthal M.D.   On: 08/01/2017 10:05    Scheduled Meds: . budesonide (PULMICORT) nebulizer solution  0.5 mg Nebulization BID  . ipratropium-albuterol  3 mL Nebulization Q6H  . methylPREDNISolone (SOLU-MEDROL) injection  40 mg Intravenous Daily   Continuous Infusions: . azithromycin Stopped (08/01/17 1321)  . cefTRIAXone (ROCEPHIN)  IV Stopped (08/01/17 1251)    Assessment/Plan:  1. Clinical sepsis with bilateral pneumonia. Continue Rocephin and Zithromax. Added Solu-Medrol and nebulizer treatments with DuoNeb and budesonide.  Hopefully will have better air entry tomorrow and potentially go home soon. 2. 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 3. Lactic acidosis secondary to sepsis  Code Status:     Code Status Orders        Start     Ordered   07/30/17 1529  Full code  Continuous     07/30/17 1528    Code Status History    Date Active Date Inactive Code Status Order ID Comments User Context   This patient has a current code status but no historical code status.     Disposition Plan: evaluated on a daily basis  Antibiotics:  Rocephin  Zithromax  Time spent: 28 minutes  Alford Highland  [employer]

## 2017-08-02 LAB — HIV ANTIBODY (ROUTINE TESTING W REFLEX): HIV Screen 4th Generation wRfx: NONREACTIVE

## 2017-08-02 MED ORDER — AZITHROMYCIN 250 MG PO TABS
250.0000 mg | ORAL_TABLET | Freq: Every day | ORAL | Status: DC
  Administered 2017-08-02 – 2017-08-03 (×2): 250 mg via ORAL
  Filled 2017-08-02 (×2): qty 1

## 2017-08-02 NOTE — Progress Notes (Signed)
Patient ID: Amanda Torres, female   DOB: September 19, 1986, 31 y.o.   MRN: [id number]  [facility] PROGRESS NOTE  Zayra Devito WUJ:[id number] DOB: Nov 23, 1985 DOA: 07/30/2017 PCP: Titus Mould, NP  HPI/Subjective: Patient feeling a little better than yesterday. She slept a little better last night. Still with some cough and shortness of breath.  Objective: Vitals:   08/02/17 0226 08/02/17 0900  BP:  (!) 104/58  Pulse:  (!) 101  Resp:  18  Temp:  97.8 F (36.6 C)  SpO2: 96% 94%    Filed Weights   07/30/17 1302  Weight: 52.6 kg (116 lb)    ROS: Review of Systems  Constitutional: Negative for chills and fever.  Eyes: Negative for blurred vision.  Respiratory: Positive for cough and shortness of breath.   Cardiovascular: Negative for chest pain.  Gastrointestinal: Negative for abdominal pain, constipation, diarrhea, nausea and vomiting.  Genitourinary: Negative for dysuria.  Musculoskeletal: Negative for joint pain.  Neurological: Negative for dizziness and headaches.   Exam: Physical Exam  HENT:  Nose: No mucosal edema.  Mouth/Throat: No oropharyngeal exudate or posterior oropharyngeal edema.  Eyes: Pupils are equal, round, and reactive to light. Conjunctivae, EOM and lids are normal.  Neck: No JVD present. Carotid bruit is not present. No edema present. No thyroid mass and no thyromegaly present.  Cardiovascular: S1 normal and S2 normal.  Exam reveals no gallop.   No murmur heard. Pulses:      Dorsalis pedis pulses are 2+ on the right side, and 2+ on the left side.  Respiratory: No respiratory distress. She has decreased breath sounds in the right lower field, the left middle field and the left lower field. She has no wheezes. She has rhonchi in the right lower field, the left middle field and the left lower field. She has no rales.  GI: Soft. Bowel sounds are normal. There is no tenderness.  Musculoskeletal:       Right shoulder: She exhibits no  swelling.  Lymphadenopathy:    She has no cervical adenopathy.  Neurological: She is alert. No cranial nerve deficit.  Skin: Skin is warm. No rash noted. Nails show no clubbing.  Psychiatric: She has a normal mood and affect.      Data Reviewed: Basic Metabolic Panel:  Recent Labs Lab 07/30/17 0840 07/30/17 1312 07/31/17 0355  NA 133* 136 140  K 3.9 2.8* 4.2  CL 100* 104 114*  CO2 22 21* 23  GLUCOSE 151* 158* 107*  BUN CREATININE 0.70 0.74 0.53  CALCIUM 9.0 8.5* 8.3*  MG  --  1.7  --    CBC:  Recent Labs Lab 07/30/17 0840 07/30/17 1312 07/31/17 0355 08/01/17 0327  WBC 18.7* 19.5* 14.6* 11.1*  NEUTROABS 17.8* 18.4*  --   --   HGB 13.2 12.7 11.6* 11.7*  HCT 39.8 37.1 34.0* 33.4*  MCV 87.8 87.5 86.8 87.1  PLT 219 215 189 225     Recent Results (from the past 240 hour(s))  Blood Culture (routine x 2)     Status: None (Preliminary result)   Collection Time: 07/30/17  1:12 PM  Result Value Ref Range Status   Specimen Description BLOOD BLOOD RIGHT HAND  Final   Special Requests   Final    BOTTLES DRAWN AEROBIC AND ANAEROBIC Blood Culture adequate volume   Culture NO GROWTH 3 DAYS  Final   Report Status PENDING  Incomplete  Blood Culture (routine x 2)     Status:  None (Preliminary result)   Collection Time: 07/30/17  1:17 PM  Result Value Ref Range Status   Specimen Description BLOOD LEFT ANTECUBITAL  Final   Special Requests   Final    BOTTLES DRAWN AEROBIC AND ANAEROBIC Blood Culture adequate volume   Culture NO GROWTH 3 DAYS  Final   Report Status PENDING  Incomplete     Studies: Ct Chest Wo Contrast  Result Date: 08/01/2017 CLINICAL DATA:  Fever, cough, shortness breath, and pleural effusions. EXAM: CT CHEST WITHOUT CONTRAST TECHNIQUE: Multidetector CT imaging of the chest was performed following the standard protocol without IV contrast. COMPARISON:  Chest radiograph on 07/31/2017 FINDINGS: Cardiovascular:  No acute findings. Mediastinum/Nodes:  No definite masses or pathologically enlarged lymph nodes identified on this unenhanced exam. Calcified mediastinal right hilar lymph nodes are consistent with old granulomatous disease. Lungs/Pleura: Bilateral lower lobe airspace disease is seen with central air bronchograms. There are also patchy areas airspace disease seen in the right upper and middle lobes and lingula. Small bilateral pleural effusions. Calcified granulomas noted in the right upper lobe. Upper Abdomen:  Unremarkable. Musculoskeletal:  No suspicious bone lesions. IMPRESSION: Bilateral pulmonary airspace disease, worst in the lower lobes, likely due to multilobar pneumonia. Continued radiographic followup recommended to confirm resolution. Small bilateral pleural effusions. Calcified old granulomatous disease. Electronically Signed   By: Myles Rosenthal M.D.   On: 08/01/2017 10:05    Scheduled Meds: . azithromycin  250 mg Oral Daily  . budesonide (PULMICORT) nebulizer solution  0.5 mg Nebulization BID  . ipratropium-albuterol  3 mL Nebulization Q6H  . methylPREDNISolone (SOLU-MEDROL) injection  40 mg Intravenous Daily   Continuous Infusions: . cefTRIAXone (ROCEPHIN)  IV 1 g (08/02/17 1237)    Assessment/Plan:  1. Clinical sepsis with bilateral pneumonia. Continue Rocephin and Zithromax. continue Solu-Medrol and nebulizer treatments with DuoNeb and budesonide.  Reevaluate on a daily basis to see when the patient can go home. I would like to have better air entry prior to disposition.   2. 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 3. Lactic acidosis secondary to sepsis  Code Status:     Code Status Orders        Start     Ordered   07/30/17 1529  Full code  Continuous     07/30/17 1528    Code Status History    Date Active Date Inactive Code Status Order ID Comments User Context   This patient has a current code status but no historical code status.     Disposition Plan: evaluated on a daily  basis  Antibiotics:  Rocephin  Zithromax  Time spent: 26 minutes  Alford Highland  [employer]

## 2017-08-03 MED ORDER — PREDNISONE 10 MG PO TABS: ORAL_TABLET | ORAL | 0 refills | Status: DC

## 2017-08-03 MED ORDER — BENZONATATE 100 MG PO CAPS: 100.0000 mg | ORAL_CAPSULE | Freq: Three times a day (TID) | ORAL | 0 refills | Status: DC | PRN

## 2017-08-03 MED ORDER — AMOXICILLIN-POT CLAVULANATE 875-125 MG PO TABS: 1.0000 | ORAL_TABLET | Freq: Two times a day (BID) | ORAL | Status: DC

## 2017-08-03 MED ORDER — ALBUTEROL SULFATE HFA 108 (90 BASE) MCG/ACT IN AERS: 2.0000 | INHALATION_SPRAY | Freq: Four times a day (QID) | RESPIRATORY_TRACT | 0 refills | Status: DC | PRN

## 2017-08-03 MED ORDER — GUAIFENESIN-CODEINE 100-10 MG/5ML PO SOLN: 10.0000 mL | Freq: Four times a day (QID) | ORAL | 0 refills | Status: DC | PRN

## 2017-08-03 MED ORDER — AMOXICILLIN-POT CLAVULANATE 875-125 MG PO TABS: 1.0000 | ORAL_TABLET | Freq: Two times a day (BID) | ORAL | 0 refills | Status: DC

## 2017-08-03 NOTE — Progress Notes (Signed)
Patient is discharged home. IVs removed with caths intact. Reviewed scripts, meds, and last dose given. Work excess reviewed with patient. Allowed time for questions.

## 2017-08-03 NOTE — Discharge Instructions (Addendum)
Recommend a follow up chest x-ray in 6 weeks Recommend quantiferon gold blood test once off steroids

## 2017-08-03 NOTE — Progress Notes (Addendum)
Patient ID: Amanda Torres, female   DOB: [birth year], 31 y.o.   MRN: [id number] [facility] [place] [facility] was admitted to the Hospital on 07/30/2017 and Discharged  08/03/2017 and should be excused from work/school   for 11 days starting 07/30/2017 , may return to work/school without any restrictions.  Please Excuse from travel while recovering from hospital stay until 08/11/2017.  Diagnosis Sepsis (A41.9), Pneumonia (J18.9).  Alford Highland M.D on 08/03/2017,at 1:24 PM  [facility] - [place] at [facility]  [phone]

## 2017-08-03 NOTE — Discharge Summary (Signed)
[facility] -  at [facility]   PATIENT NAME: Amanda Torres    MR#:  [id number]  DATE OF BIRTH:  18-Feb-1986  DATE OF ADMISSION:  07/30/2017 ADMITTING PHYSICIAN: Altamese Dilling, MD  DATE OF DISCHARGE: 08/03/2017  PRIMARY CARE PHYSICIAN: Titus Mould, NP    ADMISSION DIAGNOSIS:  Pneumonia [J18.9] Severe sepsis (HCC) [A41.9, R65.20] Sepsis, due to unspecified organism (HCC) [A41.9] Pneumonia of left lower lobe due to infectious organism (HCC) [J18.1]  DISCHARGE DIAGNOSIS:  Principal Problem:   Sepsis (HCC) Active Problems:   Pneumonia   SECONDARY DIAGNOSIS:   Past Medical History:  Diagnosis Date  . Medical history non-contributory     HOSPITAL COURSE:   1.  Clinical sepsis with bilateral pneumonia seen on CT scan. The patient was started on Rocephin and Zithromax. She finished the Zithromax course while here. I will continue another 5 days of Augmentin. Since the patient was not moving air I had to start Solu-Medrol on 08/01/2017. Patient also taking cough medications Tessalon Perles and Tussionex. The patient feeling a little bit better and would like to go home. I advised her not to travel to [place] biplane on Friday and delay this plane trip. I would like her to be seen by her primary care physician prior to taking a trip. 2. Lactic acidosis secondary to sepsis. 3. 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 Recommend a QuantiFERON Gold as outpatient.  Unable to do this while the patient is on steroids. Recommend pulmonary consultation as outpatient. 4. Also recommend out for work for a week.  DISCHARGE CONDITIONS:   Satisfactory  CONSULTS OBTAINED:   none  DRUG ALLERGIES:  No Known Allergies  DISCHARGE MEDICATIONS:   Current Discharge Medication List    START taking these medications   Details  albuterol (PROVENTIL HFA;VENTOLIN HFA) 108 (90 Base) MCG/ACT inhaler Inhale 2 puffs into the lungs every 6 (six)  hours as needed for wheezing or shortness of breath. Qty: 1 Inhaler, Refills: 0    amoxicillin-clavulanate (AUGMENTIN) 875-125 MG tablet Take 1 tablet by mouth every 12 (twelve) hours. Qty: 10 tablet, Refills: 0    guaiFENesin-codeine 100-10 MG/5ML syrup Take 10 mLs by mouth every 6 (six) hours as needed for cough. Qty: 118 mL, Refills: 0    predniSONE (DELTASONE) 10 MG tablet 4 tabs po day1; 3 tabs po day 2; 2 tabs po day3; 1 tab po day4; 1/2 tab day5,6 Qty: 11 tablet, Refills: 0      CONTINUE these medications which have CHANGED   Details  benzonatate (TESSALON) 100 MG capsule Take 1 capsule (100 mg total) by mouth 3 (three) times daily as needed. Qty: 21 capsule, Refills: 0      CONTINUE these medications which have NOT CHANGED   Details  acetaminophen (TYLENOL) 325 MG tablet Take 650 mg by mouth every 6 (six) hours as needed.    ibuprofen (ADVIL,MOTRIN) 100 MG tablet Take 100 mg by mouth every 6 (six) hours as needed for fever.      STOP taking these medications     azithromycin (ZITHROMAX Z-PAK) 250 MG tablet          DISCHARGE INSTRUCTIONS:   Follow-up PMD one week  If you experience worsening of your admission symptoms, develop shortness of breath, life threatening emergency, suicidal or homicidal thoughts you must seek medical attention immediately by calling 911 or calling your MD immediately  if symptoms less severe.  You Must read complete instructions/literature along with all  the possible adverse reactions/side effects for all the Medicines you take and that have been prescribed to you. Take any new Medicines after you have completely understood and accept all the possible adverse reactions/side effects.   Please note  You were cared for by a hospitalist during your hospital stay. If you have any questions about your discharge medications or the care you received while you were in the hospital after you are discharged, you can call the unit and asked to speak  with the hospitalist on call if the hospitalist that took care of you is not available. Once you are discharged, your primary care physician will handle any further medical issues. Please note that NO REFILLS for any discharge medications will be authorized once you are discharged, as it is imperative that you return to your primary care physician (or establish a relationship with a primary care physician if you do not have one) for your aftercare needs so that they can reassess your need for medications and monitor your lab values.    Today   CHIEF COMPLAINT:   Chief Complaint  Patient presents with  . Shortness of Breath    HISTORY OF PRESENT ILLNESS:  Amanda Torres  is a 31 y.o. female presented with shortness of breath   VITAL SIGNS:  Blood pressure (!) 110/55, pulse 75, temperature 98.5 F (36.9 C), temperature source Oral, resp. rate 18, height  (1.549 m), weight 52.6 kg (116 lb), last menstrual period 07/30/2017, SpO2 96 %.    PHYSICAL EXAMINATION:  GENERAL:  31 y.o.-year-old patient lying in the bed with no acute distress.  EYES: Pupils equal, round, reactive to light and accommodation. No scleral icterus. Extraocular muscles intact.  HEENT: Head atraumatic, normocephalic. Oropharynx and nasopharynx clear.  NECK:  Supple, no jugular venous distention. No thyroid enlargement, no tenderness.  LUNGS: Decreased breath sounds bilateral bases, positive rhonchi at bases. No use of accessory muscles of respiration.  CARDIOVASCULAR: S1, S2 normal. No murmurs, rubs, or gallops.  ABDOMEN: Soft, non-tender, non-distended. Bowel sounds present. No organomegaly or mass.  EXTREMITIES: No pedal edema, cyanosis, or clubbing.  NEUROLOGIC: Cranial nerves II through XII are intact. Muscle strength 5/5 in all extremities. Sensation intact. Gait not checked.  PSYCHIATRIC: The patient is alert and oriented x 3.  SKIN: No obvious rash, lesion, or ulcer.   DATA REVIEW:   CBC  Recent  Labs Lab 08/01/17 0327  WBC 11.1*  HGB 11.7*  HCT 33.4*  PLT 225    Chemistries   Recent Labs Lab 07/30/17 1312 07/31/17 0355  NA 136 140  K 2.8* 4.2  CL 104 114*  CO2 21* 23  GLUCOSE 158* 107*  BUN 9 7  CREATININE 0.74 0.53  CALCIUM 8.5* 8.3*  MG 1.7  --      Microbiology Results  Results for orders placed or performed during the hospital encounter of 07/30/17  Blood Culture (routine x 2)     Status: None (Preliminary result)   Collection Time: 07/30/17  1:12 PM  Result Value Ref Range Status   Specimen Description BLOOD BLOOD RIGHT HAND  Final   Special Requests   Final    BOTTLES DRAWN AEROBIC AND ANAEROBIC Blood Culture adequate volume   Culture NO GROWTH 4 DAYS  Final   Report Status PENDING  Incomplete  Blood Culture (routine x 2)     Status: None (Preliminary result)   Collection Time: 07/30/17  1:17 PM  Result Value Ref Range Status   Specimen  Description BLOOD LEFT ANTECUBITAL  Final   Special Requests   Final    BOTTLES DRAWN AEROBIC AND ANAEROBIC Blood Culture adequate volume   Culture NO GROWTH 4 DAYS  Final   Report Status PENDING  Incomplete      Management plans discussed with the patient, And she is in agreement.  CODE STATUS:     Code Status Orders        Start     Ordered   07/30/17 1529  Full code  Continuous     07/30/17 1528    Code Status History    Date Active Date Inactive Code Status Order ID Comments User Context   This patient has a current code status but no historical code status.      TOTAL TIME TAKING CARE OF THIS PATIENT: 35 minutes.    Alford Highland M.D on 08/03/2017 at 1:45 PM  Between 7am to 6pm - Pager - [phone]  After 6pm go to [email or web address] - password [email or web address]  [employer] [facility] Office  [phone]  CC: Primary care physician; Titus Mould, NP

## 2017-08-04 LAB — CULTURE, BLOOD (ROUTINE X 2)
Culture: NO GROWTH
Culture: NO GROWTH
SPECIAL REQUESTS: ADEQUATE
Special Requests: ADEQUATE

## 2017-08-24 ENCOUNTER — Ambulatory Visit
Admission: RE | Admit: 2017-08-24 | Discharge: 2017-08-24 | Disposition: A | Discharge status: Home / Self Care | Payer: BC Managed Care – PPO | Source: Ambulatory Visit | Attending: Pulmonary Disease | Admitting: Pulmonary Disease

## 2017-08-24 ENCOUNTER — Encounter: Payer: Self-pay | Admitting: *Deleted

## 2017-08-24 ENCOUNTER — Ambulatory Visit (INDEPENDENT_AMBULATORY_CARE_PROVIDER_SITE_OTHER): Payer: BC Managed Care – PPO | Admitting: Pulmonary Disease

## 2017-08-24 ENCOUNTER — Encounter: Payer: Self-pay | Admitting: Pulmonary Disease

## 2017-08-24 VITALS — BP 94/64 | HR 69 | Resp 16 | Ht 61.0 in | Wt 114.0 lb

## 2017-08-24 DIAGNOSIS — J984 Other disorders of lung: Secondary | ICD-10-CM | POA: Diagnosis not present

## 2017-08-24 DIAGNOSIS — R599 Enlarged lymph nodes, unspecified: Secondary | ICD-10-CM | POA: Diagnosis not present

## 2017-08-24 DIAGNOSIS — Z23 Encounter for immunization: Secondary | ICD-10-CM

## 2017-08-24 DIAGNOSIS — J189 Pneumonia, unspecified organism: Secondary | ICD-10-CM

## 2017-08-24 MED ORDER — PNEUMOCOCCAL VAC POLYVALENT 25 MCG/0.5ML IJ INJ
0.5000 mL | INJECTION | INTRAMUSCULAR | Status: DC
  Administered 2017-08-29: 0.5 mL via INTRAMUSCULAR

## 2017-08-24 NOTE — Patient Instructions (Addendum)
Pneumonia shot today  Chest x-ray today. If there are any residual abnormalities on this, we will contact you and arrange for a repeat chest x-ray in 3 months. Otherwise, I think you are improving as one would expect. Continue to progress her activity back to normal as tolerated.  No follow-up has been scheduled

## 2017-08-25 ENCOUNTER — Telehealth: Payer: Self-pay | Admitting: *Deleted

## 2017-08-25 NOTE — Telephone Encounter (Signed)
-----   Message from Merwyn Katos, MD sent at 08/24/2017  4:47 PM EDT ----- Let her know that her CXR is entirely normal now. She will need no further chest Xrays or follow up with Amanda  Theodoro Torres

## 2017-08-25 NOTE — Telephone Encounter (Signed)
Pt informed of results. Nothing further needed. 

## 2017-08-26 ENCOUNTER — Telehealth: Payer: Self-pay | Admitting: *Deleted

## 2017-08-26 NOTE — Telephone Encounter (Signed)
Patient aware.

## 2017-08-26 NOTE — Telephone Encounter (Signed)
-----   Message from David B Simonds, MD sent at 08/24/2017  4:47 PM EDT ----- Let her know that her CXR is entirely normal now. She will need no further chest Xrays or follow up with us  Dave 

## 2017-08-28 NOTE — Progress Notes (Signed)
PULMONARY CONSULT NOTE  Requesting MD/Service: Hospitalist Service  Date of initial consultation: 08/24/17 Reason for consultation: Follow up CAP  PT PROFILE: 31 y.o. female never smoker hospitalized 09/15-09/19/18 for CAP  HPI:  As above. She presented with 2 days of symptoms - initially sinus and nasal congestion, then throat tickle and discomfort, then cough, fever, malaise. CXR revealed bibasilar infiltrates and CT chest confirmed these findings. CT chest also revealed calcified pulmonary nodules and hilar LAN. She was treated with ceftriaxone and azithromycin in the hospital and was discharged on augmentin. She continued to feel poorly and was provided a course of levofloxacin by her primary MD after discharge. She now feels 80% back to baseline. No further fevers or malaise. Just continues to feel fatigue. 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 Denies CP, fever, purulent sputum, hemoptysis, LE edema and calf tenderness.  Past Medical History:  Diagnosis Date  . Medical history non-contributory     Past Surgical History:  Procedure Laterality Date  . CHOLECYSTECTOMY    . TONSILLECTOMY      MEDICATIONS: I have reviewed all medications and confirmed regimen as documented  Social History   Social History  . Marital status: Married    Spouse name: N/A  . Number of children: N/A  . Years of education: N/A   Occupational History  . Not on file.   Social History Main Topics  . Smoking status: Never Smoker  . Smokeless tobacco: Never Used  . Alcohol use No  . Drug use: No  . Sexual activity: Not on file   Other Topics Concern  . Not on file   Social History Narrative  . No narrative on file    Family History  Problem Relation Age of Onset  . Lung disease Mother     ROS: No fever, myalgias/arthralgias, unexplained weight loss or weight gain No new focal weakness or sensory deficits No  otalgia, hearing loss, visual changes, nasal and sinus symptoms, mouth and throat problems No neck pain or adenopathy No abdominal pain, N/V/D, diarrhea, change in bowel pattern No dysuria, change in urinary pattern   Vitals:   08/24/17 0853 08/24/17 0901  BP:  94/64  Pulse:  69  Resp: 16   SpO2:  96%  Weight: 51.7 kg (114 lb)   Height:  (1.549 m)      EXAM:  Gen: WDWN, No overt respiratory distress HEENT: NCAT, sclera white, oropharynx normal Neck: Supple without LAN, thyromegaly, JVD Lungs: breath sounds full without adventitious sounds Cardiovascular: RRR, no murmurs noted Abdomen: Soft, nontender, normal BS Ext: without clubbing, cyanosis, edema Neuro: CNs grossly intact, motor and sensory intact Skin: Limited exam, no lesions noted  DATA:   BMP Latest Ref Rng & Units 07/31/2017 07/30/2017 07/30/2017  Glucose 65 - 99 mg/dL 875(I) 433(I) 951(O)  BUN 6 - 20 mg/dL Creatinine 0.44 - 1.00 mg/dL 8.41 6.60 6.30  Sodium 135 - 145 mmol/L 140 136 133(L)  Potassium 3.5 - 5.1 mmol/L 4.2 2.8(L) 3.9  Chloride 101 - 111 mmol/L 114(H) 104 100(L)  CO2 22 - 32 mmol/L 23 21(L) 22  Calcium 8.9 - 10.3 mg/dL 8.3(L) 8.5(L) 9.0    CBC Latest Ref Rng & Units 08/01/2017 07/31/2017 07/30/2017  WBC 3.6 - 11.0 K/uL 11.1(H) 14.6(H) 19.5(H)  Hemoglobin 12.0 - 16.0 g/dL 11.7(L) 11.6(L) 12.7  Hematocrit 35.0 - 47.0 % 33.4(L) 34.0(L) 37.1  Platelets 150 -  440 K/uL 225 189 215    CXR (08/24/17): NACPD. Previous infiltrates have resolved    IMPRESSION:     ICD-10-CM   1. Community acquired pneumonia, unspecified laterality J18.9 DG Chest 2 View  2. Granulomatous adenopathy R59.9    She grew up in [place] near the [facility]. 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 She should  continue to advance her activity back to her previous level as tolerated. Pneumonia vaccine was administered on the day of this encounter. No follow up is scheduled.    Billy Fischer, MD PCCM service Mobile [phone] Pager [phone] 08/28/2017 8:38 PM

## 2017-08-29 MED ORDER — PNEUMOCOCCAL VAC POLYVALENT 25 MCG/0.5ML IJ INJ: 0.5000 mL | INJECTION | INTRAMUSCULAR | Status: DC

## 2017-08-29 NOTE — Addendum Note (Signed)
Addended by: Alease Frame on: 08/29/2017 02:51 PM   Modules accepted: Orders

## 2019-05-03 IMAGING — CR DG CHEST 2V
2 series · 2 of 2 positions shown · non-contrast
Comparison: None.

CLINICAL DATA: Nonproductive cough, congestion, fever and shortness
of breath over the last week.

EXAM:
CHEST  2 VIEW

[chest pa]
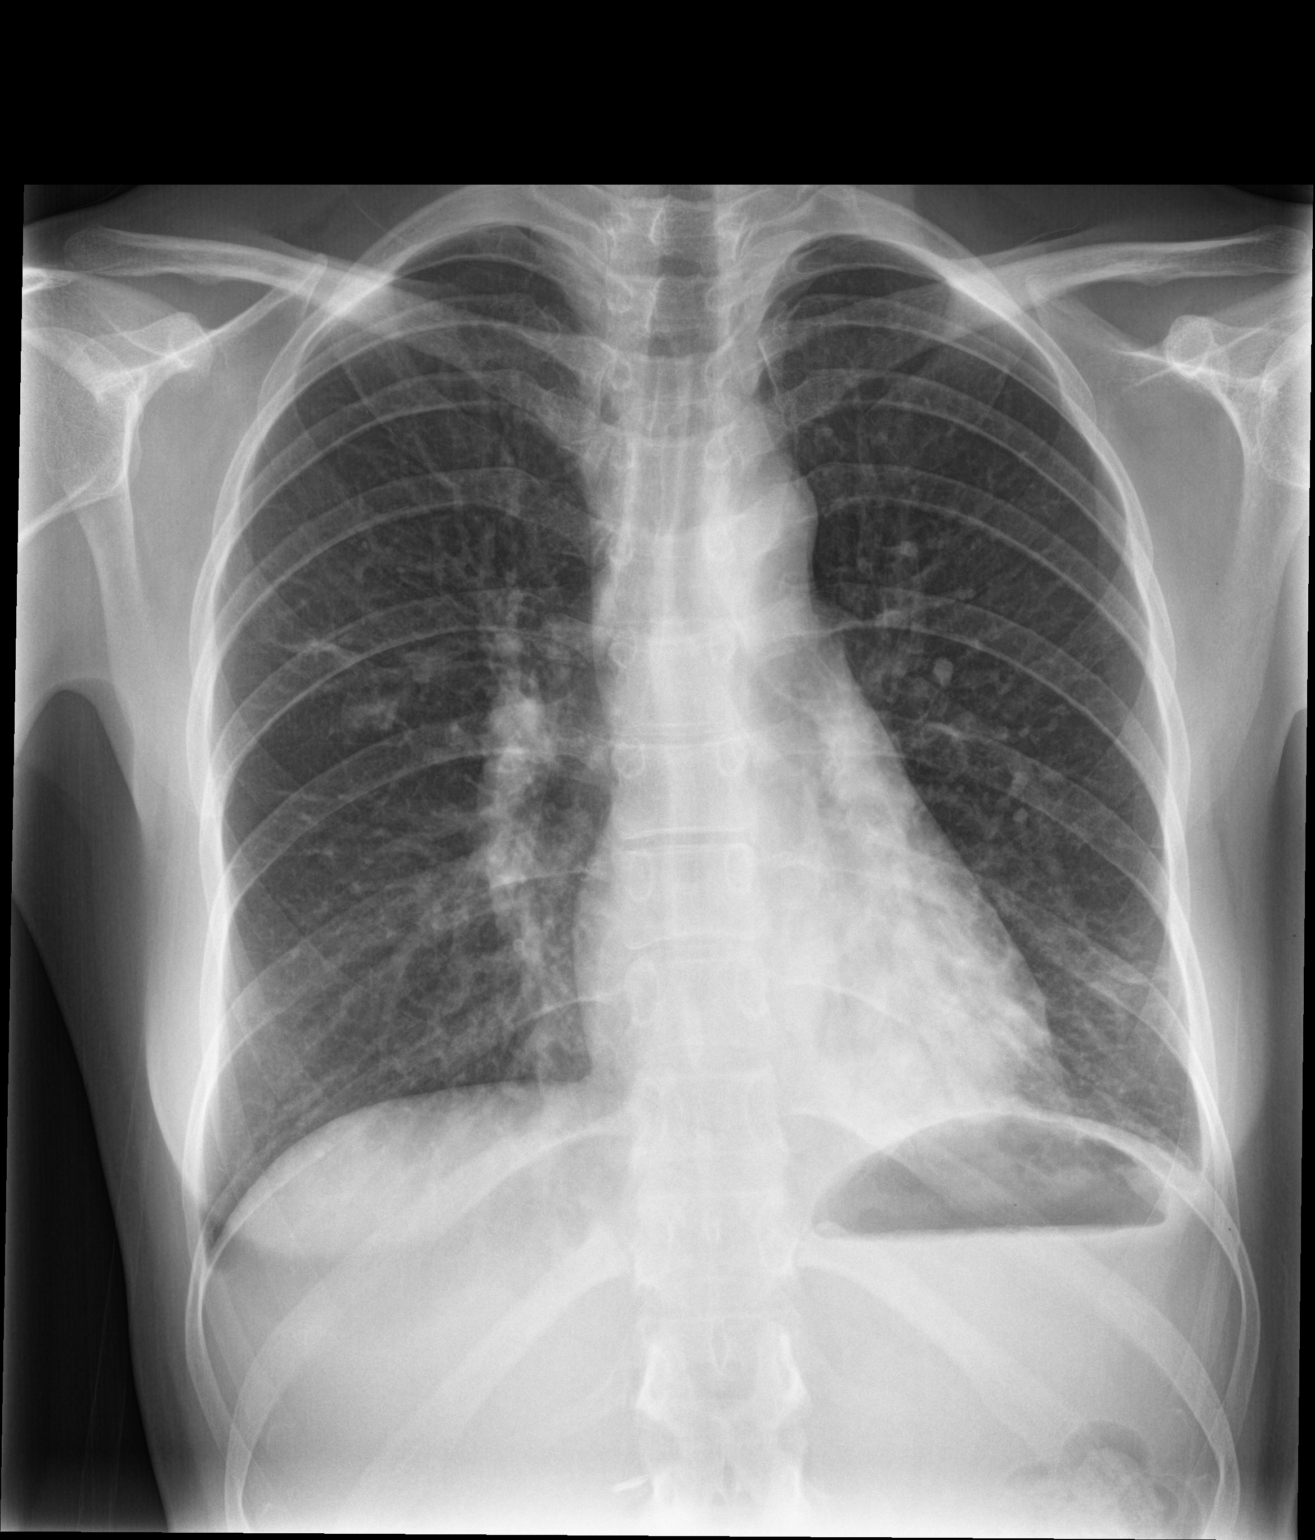

[chest lat]
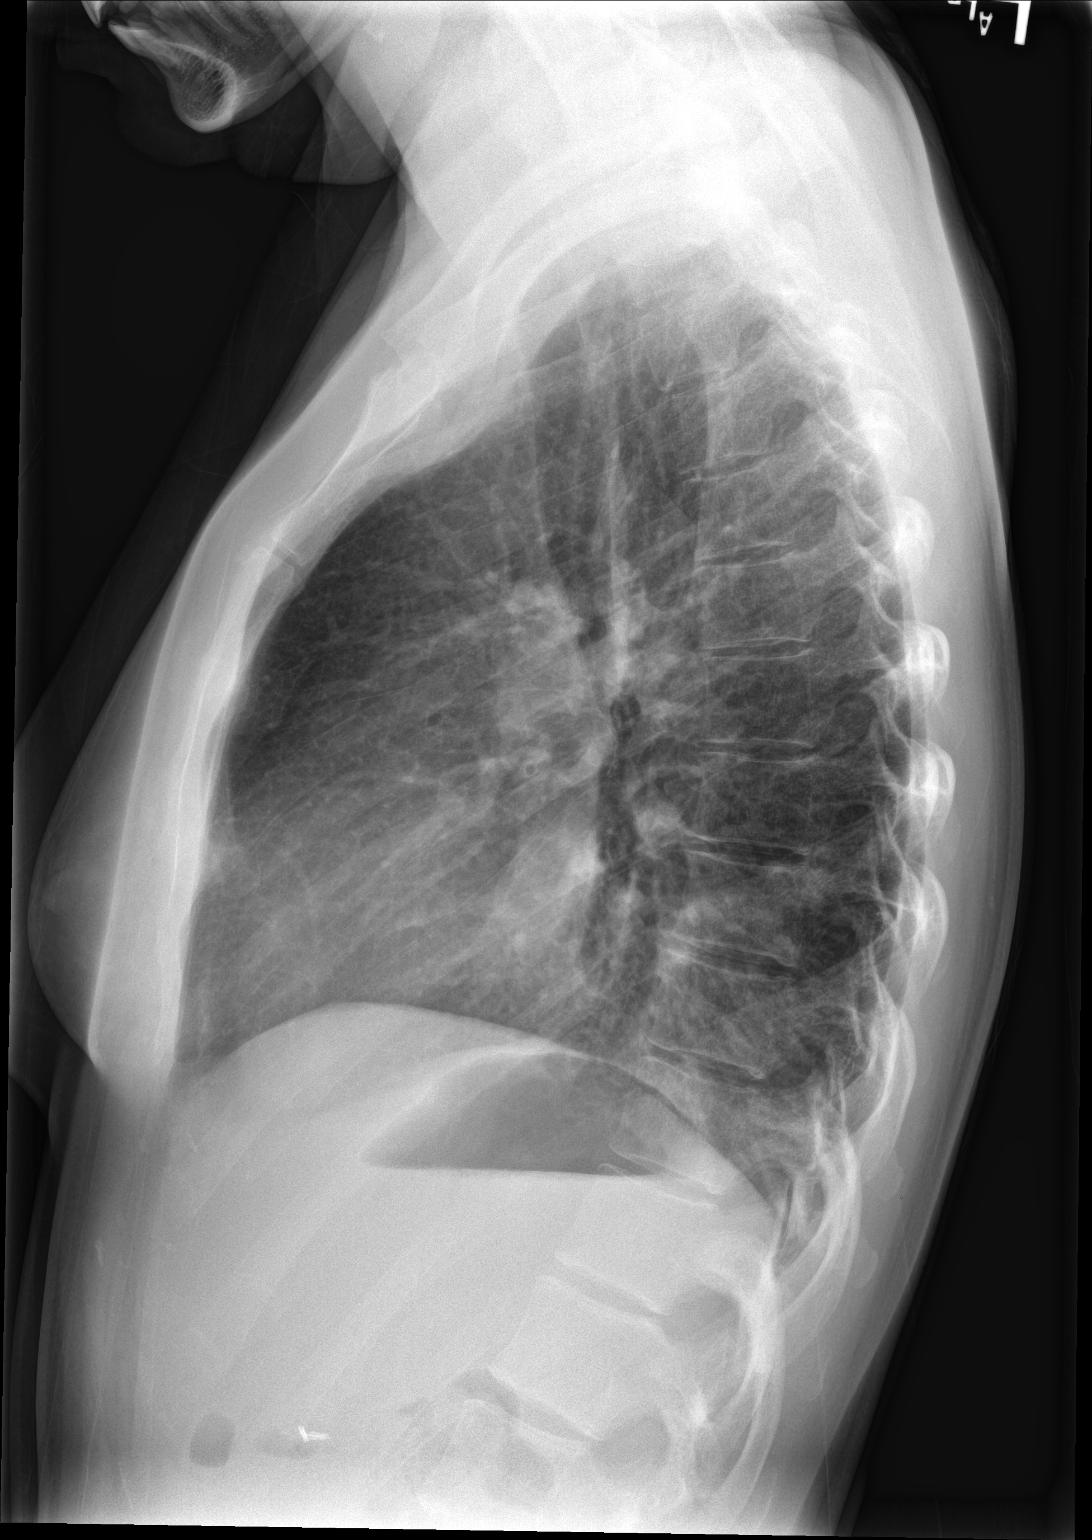

[2 of 2 positions shown; findings below may reference images not displayed]

FINDINGS: Heart size is normal. Mediastinal shadows are normal. There is left
lower lobe pneumonia. There may be mild patchy pneumonia at the
right lung base. Linear density in the right mid lung probably
represents scar. Follow-up on subsequent radiographs.
IMPRESSION: Lower lobe pneumonia left worse than right.

## 2022-05-14 ENCOUNTER — Other Ambulatory Visit: Payer: Self-pay

## 2022-05-14 ENCOUNTER — Emergency Department: Payer: BC Managed Care – PPO

## 2022-05-14 DIAGNOSIS — N132 Hydronephrosis with renal and ureteral calculous obstruction: Secondary | ICD-10-CM | POA: Diagnosis not present

## 2022-05-14 DIAGNOSIS — R109 Unspecified abdominal pain: Secondary | ICD-10-CM | POA: Diagnosis present

## 2022-05-14 LAB — BASIC METABOLIC PANEL
Anion gap: 6 (ref 5–15)
BUN: 16 mg/dL (ref 6–20)
CO2: 28 mmol/L (ref 22–32)
Calcium: 9.1 mg/dL (ref 8.9–10.3)
Chloride: 104 mmol/L (ref 98–111)
Creatinine, Ser: 0.8 mg/dL (ref 0.44–1.00)
GFR, Estimated: >60 mL/min (ref >60)
Glucose, Bld: 100 mg/dL — ABNORMAL HIGH (ref 70–99)
Potassium: 3.7 mmol/L (ref 3.5–5.1)
Sodium: 138 mmol/L (ref 135–145)

## 2022-05-14 LAB — CBC
HCT: 39.8 % (ref 36.0–46.0)
Hemoglobin: 12.5 g/dL (ref 12.0–15.0)
MCH: 28.5 pg (ref 26.0–34.0)
MCHC: 31.4 g/dL (ref 30.0–36.0)
MCV: 90.7 fL (ref 80.0–100.0)
Platelets: 289 10*3/uL (ref 150–400)
RBC: 4.39 MIL/uL (ref 3.87–5.11)
RDW: 12.5 % (ref 11.5–15.5)
WBC: 7.5 10*3/uL (ref 4.0–10.5)
nRBC: 0 % (ref 0.0–0.2)

## 2022-05-14 LAB — URINALYSIS, ROUTINE W REFLEX MICROSCOPIC
Bilirubin Urine: NEGATIVE
Glucose, UA: NEGATIVE mg/dL
Ketones, ur: NEGATIVE mg/dL
Nitrite: NEGATIVE
Protein, ur: 30 mg/dL — AB
RBC / HPF: >50 RBC/hpf — ABNORMAL HIGH (ref 0–5)
Specific Gravity, Urine: 1.025 (ref 1.005–1.030)
pH: 7 (ref 5.0–8.0)

## 2022-05-14 LAB — POC URINE PREG, ED: Preg Test, Ur: NEGATIVE

## 2022-05-14 MED ORDER — ONDANSETRON 4 MG PO TBDP
4.0000 mg | ORAL_TABLET | Freq: Once | ORAL | Status: AC | PRN
  Administered 2022-05-14: 4 mg via ORAL
  Filled 2022-05-14: qty 1

## 2022-05-14 NOTE — ED Triage Notes (Signed)
Pt has been in [place] for the past week for a wedding, last night developed left lower back pain.  States had some anbx for UTI at the first of the month.  Ibuprofen is no longer helping and now is vomiting.  No urinary symptoms.  No fever, chills, diarrhea.

## 2022-05-14 NOTE — ED Notes (Signed)
Pt states she feels worse and has vomited. Will place protocol order for zofran.

## 2022-05-15 ENCOUNTER — Emergency Department
Admission: EM | Admit: 2022-05-15 | Discharge: 2022-05-15 | Disposition: A | Discharge status: Home / Self Care | Payer: BC Managed Care – PPO | Attending: Emergency Medicine | Admitting: Emergency Medicine

## 2022-05-15 DIAGNOSIS — N132 Hydronephrosis with renal and ureteral calculous obstruction: Secondary | ICD-10-CM

## 2022-05-15 DIAGNOSIS — N200 Calculus of kidney: Secondary | ICD-10-CM

## 2022-05-15 MED ORDER — KETOROLAC TROMETHAMINE 15 MG/ML IJ SOLN
15.0000 mg | Freq: Once | INTRAMUSCULAR | Status: AC
  Administered 2022-05-15: 15 mg via INTRAVENOUS
  Filled 2022-05-15: qty 1

## 2022-05-15 MED ORDER — OXYCODONE-ACETAMINOPHEN 5-325 MG PO TABS: 2.0000 | ORAL_TABLET | Freq: Four times a day (QID) | ORAL | 0 refills | Status: DC | PRN

## 2022-05-15 MED ORDER — DOCUSATE SODIUM 100 MG PO CAPS: ORAL_CAPSULE | ORAL | 0 refills | Status: DC

## 2022-05-15 MED ORDER — ONDANSETRON 4 MG PO TBDP: ORAL_TABLET | ORAL | 0 refills | Status: DC

## 2022-05-15 MED ORDER — CEFADROXIL 500 MG PO CAPS: 1000.0000 mg | ORAL_CAPSULE | Freq: Two times a day (BID) | ORAL | 0 refills | Status: AC

## 2022-05-15 MED ORDER — TAMSULOSIN HCL 0.4 MG PO CAPS: ORAL_CAPSULE | ORAL | 0 refills | Status: DC

## 2022-05-15 MED ORDER — SODIUM CHLORIDE 0.9 % IV BOLUS
1000.0000 mL | Freq: Once | INTRAVENOUS | Status: AC
  Administered 2022-05-15: 1000 mL via INTRAVENOUS

## 2022-05-15 MED ORDER — SODIUM CHLORIDE 0.9 % IV SOLN
1.0000 g | INTRAVENOUS | Status: AC
  Administered 2022-05-15: 1 g via INTRAVENOUS
  Filled 2022-05-15: qty 10

## 2022-05-15 NOTE — ED Provider Notes (Signed)
[facility] Provider Note    Event Date/Time   First MD Initiated Contact with Patient 05/15/22 0321     (approximate)   History   Flank Pain   HPI  Zayneb Bronson is a 36 y.o. female who presents for evaluation of severe left flank pain.  She said that for a couple of weeks she has not felt exactly right with intermittent pain in her abdomen or pelvis.  She has been seen by her primary care doctor and they could not find anything wrong.  She then went on vacation in [place] and everything was okay, but then last night she had a cute onset and severe left sided lower back pain.  Ibuprofen helps a little bit but the pain was coming in waves and was sharp and stabbing and causing her to have nausea and vomiting.  She has no dysuria nor increased urinary frequency.  No prior history of kidney stones.     Physical Exam   Triage Vital Signs: ED Triage Vitals  Enc Vitals Group     BP 05/14/22 2141 111/60     Pulse Rate 05/14/22 2141 65     Resp 05/14/22 2141 16     Temp 05/14/22 2141 98 F (36.7 C)     Temp Source 05/14/22 2141 Axillary     SpO2 05/14/22 2141 100 %     Weight --      Height --      Head Circumference --      Peak Flow --      Pain Score 05/14/22 2143 6     Pain Loc --      Pain Edu? --      Excl. in [place]? --     Most recent vital signs: Vitals:   05/15/22 0340 05/15/22 0400  BP: (!) 103/57 101/69  Pulse: 76 77  Resp: 15 15  Temp:    SpO2: 100% 99%     General: Awake, no distress.  CV:  Good peripheral perfusion.  Resp:  Normal effort.  Abd:  No distention.  No tenderness to palpation of the abdomen.  Some left flank tenderness to percussion. Other:  No focal neurological deficits.   ED Results / Procedures / Treatments   Labs (all labs ordered are listed, but only abnormal results are displayed) Labs Reviewed  URINALYSIS, ROUTINE W REFLEX MICROSCOPIC - Abnormal; Notable for the following components:      Result Value    Color, Urine YELLOW (*)    APPearance CLOUDY (*)    Hgb urine dipstick MODERATE (*)    Protein, ur 30 (*)    Leukocytes,Ua TRACE (*)    RBC / HPF >50 (*)    Bacteria, UA MANY (*)    All other components within normal limits  BASIC METABOLIC PANEL - Abnormal; Notable for the following components:   Glucose, Bld 100 (*)    All other components within normal limits  URINE CULTURE  CBC  POC URINE PREG, ED     RADIOLOGY I viewed and interpreted the patient's CT renal stone protocol and cannot appreciate the hydronephrosis of her left kidney.  The radiologist mentioned a probable 4 mm stone in the distal left ureter.    PROCEDURES:  Critical Care performed: No  Procedures   MEDICATIONS ORDERED IN ED: Medications  ondansetron (ZOFRAN-ODT) disintegrating tablet 4 mg (4 mg Oral Given 05/14/22 2319)  sodium chloride 0.9 % bolus 1,000 mL (0 mLs Intravenous Stopped 05/15/22  6063)  ketorolac (TORADOL) 15 MG/ML injection 15 mg (15 mg Intravenous Given 05/15/22 0219)  cefTRIAXone (ROCEPHIN) 1 g in sodium chloride 0.9 % 100 mL IVPB (1 g Intravenous New Bag/Given 05/15/22 0348)     IMPRESSION / MDM / ASSESSMENT AND PLAN / ED COURSE  I reviewed the triage vital signs and the nursing notes.                              Differential diagnosis includes, but is not limited to, UTI/pyelonephritis, renal/ureteral stone, adnexal abnormality, less likely PE.  Patient's presentation is most consistent with acute presentation with potential threat to life or bodily function.  Vital signs are stable and within normal limits.  Patient was in a great deal of discomfort when she first arrived.  With my order she received Toradol 15 mg IV and a 1 L normal saline IV bolus while she was waiting evaluation as well as 4 mg Zofran ODT.  Labs ordered include BMP, CBC, urinalysis, urine culture, and point-of-care pregnancy test.  CT renal stone protocol also ordered.  The Toradol completely resolved the  patient's pain.  She says she can still feel some aching on that side but the sharp pain is completely gone and she feels much better.  U pregnant active, no leukocytosis on CBC, and no abnormalities on basic metabolic panel.  Urinalysis shows hematuria, likely due to the ureteral stone, but also shows many bacteria.  However this could be due to contamination because there is also the presence of squamous epithelials.  Urine culture is pending.  As document above, I reviewed the CT scan and there is a distal left ureteral stone which corresponds with the site of her discomfort.  Patient is asymptomatic and feeling well and ready to go home.  However I explained to her about the equivocal UA and that, given the dangers of nonobstructive stone that is also affected, I am treating her empirically with ceftriaxone 1 g IV and will discharge her on cefadroxil, along with Percocet, Zofran, Flomax, and Colace.  She understands and agrees with the plan.  I am also giving her follow-up information with [facility].  I gave strict return precautions and she agrees.  I considered hospitalization, mostly based on the question of an infected stone, but given no sign of systemic infection and no sign that she requires emergent urological intervention, I think she should be appropriate for discharge and outpatient follow-up.         FINAL CLINICAL IMPRESSION(S) / ED DIAGNOSES   Final diagnoses:  Ureteral stone with hydronephrosis  Kidney stone     Rx / DC Orders   ED Discharge Orders          Ordered    cefadroxil (DURICEF) 500 MG capsule  2 times daily        05/15/22 0420    tamsulosin (FLOMAX) 0.4 MG CAPS capsule        05/15/22 0420    oxyCODONE-acetaminophen (PERCOCET) 5-325 MG tablet  Every 6 hours PRN        05/15/22 0420    ondansetron (ZOFRAN-ODT) 4 MG disintegrating tablet        05/15/22 0420    docusate sodium (COLACE) 100 MG capsule        05/15/22 0420              Note:  This document was prepared using Dragon voice recognition software  and may include unintentional dictation errors.   Loleta Rose, MD 05/15/22 [phone]

## 2022-05-15 NOTE — ED Notes (Signed)
Pt states nausea improved.

## 2022-05-15 NOTE — Discharge Instructions (Signed)
You have been seen in the Emergency Department (ED) today for pain caused by kidney stones.  As we have discussed, please drink plenty of fluids.  Please make a follow up appointment with the physician(s) listed elsewhere in this documentation.  You may take pain medication as needed but ONLY as prescribed.  Please also take your prescribed Flomax daily.  If you are going to follow up with Urology for possible lithotripsy, please do not take any ibuprofen, naproxen, aspirin, Toradol, or other NSAID after Tuesday morning, as this may exclude you from lithotripsy.  Please see your doctor as soon as possible as stones may take 1-3 weeks to pass and you may require additional care or medications.  Do not drink alcohol, drive or participate in any other potentially dangerous activities while taking opiate pain medication as it may make you sleepy. Do not take this medication with any other sedating medications, either prescription or over-the-counter. If you were prescribed Percocet or Vicodin, do not take these with acetaminophen (Tylenol) as it is already contained within these medications.   Take Percocet as needed for severe pain.  This medication is an opiate (or narcotic) pain medication and can be habit forming.  Use it as little as possible to achieve adequate pain control.  Do not use or use it with extreme caution if you have a history of opiate abuse or dependence.  If you are on a pain contract with your primary care doctor or a pain specialist, be sure to let them know you were prescribed this medication today from the [facility] Emergency Department.  This medication is intended for your use only - do not give any to anyone else and keep it in a secure place where nobody else, especially children, have access to it.  It will also cause or worsen constipation, so you may want to consider taking an over-the-counter stool softener while you are taking this medication.  We also prescribed for  you an antibiotic because you have some bacteria in your urine, and having a urinary tract infection with stone can be a bad combination.  Please take the full course of antibiotic (cefadroxil, or Duricef) unless otherwise directed by another physician.  Return to the Emergency Department (ED) or call your doctor if you have any worsening pain, fever, painful urination, are unable to urinate, or develop other symptoms that concern you.

## 2022-05-16 LAB — URINE CULTURE

## 2022-05-24 ENCOUNTER — Ambulatory Visit: Payer: BC Managed Care – PPO | Admitting: Urology

## 2022-05-24 ENCOUNTER — Ambulatory Visit
Admission: RE | Admit: 2022-05-24 | Discharge: 2022-05-24 | Disposition: A | Discharge status: Home / Self Care | Payer: BC Managed Care – PPO | Source: Ambulatory Visit | Attending: Urology | Admitting: Urology

## 2022-05-24 ENCOUNTER — Other Ambulatory Visit: Payer: Self-pay | Admitting: *Deleted

## 2022-05-24 ENCOUNTER — Ambulatory Visit
Admission: RE | Admit: 2022-05-24 | Discharge: 2022-05-24 | Disposition: A | Discharge status: Home / Self Care | Payer: BC Managed Care – PPO | Attending: Urology | Admitting: Urology

## 2022-05-24 ENCOUNTER — Encounter: Payer: Self-pay | Admitting: Urology

## 2022-05-24 VITALS — BP 95/62 | HR 89 | Ht 61.0 in | Wt 114.0 lb

## 2022-05-24 DIAGNOSIS — N132 Hydronephrosis with renal and ureteral calculous obstruction: Secondary | ICD-10-CM

## 2022-05-24 DIAGNOSIS — N201 Calculus of ureter: Secondary | ICD-10-CM | POA: Diagnosis not present

## 2022-05-24 DIAGNOSIS — N200 Calculus of kidney: Secondary | ICD-10-CM | POA: Insufficient documentation

## 2022-05-24 DIAGNOSIS — N23 Unspecified renal colic: Secondary | ICD-10-CM | POA: Diagnosis not present

## 2022-05-24 NOTE — H&P (View-Only) (Signed)
05/24/2022 8:34 AM   Kaitrin Anne Shutter 11-11-1986 [id number]  Referring provider: Hilton Sinclair, PA-C [address] [place],  [place] [address]  Chief Complaint  Patient presents with   Nephrolithiasis    HPI: Amanda Torres is a 36 y.o. female who presents in follow-up of recent ED visit for renal colic.  Presented to [facility] ED 05/15/2022 with severe left flank pain radiating to the left lower quadrant.  Had noted intermittent mild pain in the abdomen/pelvis for 2 weeks prior At the time of ED visit she did have nausea/vomiting.  Denied fever or chills.  Urinalysis showed >50 RBCs/6-10 WBCs.  Bacteria are present in the 11-20 squamous epithelial cells.  She was started on empiric antibiotics.  Urine culture grew multiple species. Stone protocol CT showed mild left hydronephrosis/hydroureter and a probable 4 mm left distal ureteral calculus though the ureter was not clearly defined She received ketorolac, antiemetics and Rocephin.  Was discharged on tamsulosin, Duricef and oxycodone. Since her ED visit she has noted significant improvement in her pain.  She is presently having mild pelvic pressure No prior history of stone disease  PMH: Past Medical History:  Diagnosis Date   Medical history non-contributory     Surgical History: Past Surgical History:  Procedure Laterality Date   CHOLECYSTECTOMY     TONSILLECTOMY      Home Medications:  Allergies as of 05/24/2022       Reactions   Lactose Diarrhea   Influenza Virus Vaccine Rash   Meningococcal Polysaccharide Vaccine Acwy Rash        Medication List        Accurate as of May 24, 2022  8:34 AM. If you have any questions, ask your nurse or doctor.          acetaminophen 325 MG tablet Commonly known as: TYLENOL Take 650 mg by mouth every 6 (six) hours as needed.   albuterol 108 (90 Base) MCG/ACT inhaler Commonly known as: VENTOLIN HFA Inhale 2 puffs into the lungs every 6 (six) hours as needed for  wheezing or shortness of breath.   benzonatate 100 MG capsule Commonly known as: TESSALON Take 1 capsule (100 mg total) by mouth 3 (three) times daily as needed.   docusate sodium 100 MG capsule Commonly known as: Colace Take 1 tablet once or twice daily as needed for constipation while taking narcotic pain medicine   ibuprofen 100 MG tablet Commonly known as: ADVIL Take 100 mg by mouth every 6 (six) hours as needed for fever.   ondansetron 4 MG disintegrating tablet Commonly known as: ZOFRAN-ODT Allow 1-2 tablets to dissolve in your mouth every 8 hours as needed for nausea/vomiting   oxyCODONE-acetaminophen 5-325 MG tablet Commonly known as: Percocet Take 2 tablets by mouth every 6 (six) hours as needed for severe pain.   tamsulosin 0.4 MG Caps capsule Commonly known as: FLOMAX Take 1 tablet by mouth daily until you pass the kidney stone or no longer have symptoms        Allergies:  Allergies  Allergen Reactions   Lactose Diarrhea   Influenza Virus Vaccine Rash   Meningococcal Polysaccharide Vaccine Acwy Rash    Family History: Family History  Problem Relation Age of Onset   Lung disease Mother     Social History:  reports that she has never smoked. She has never used smokeless tobacco. She reports that she does not drink alcohol and does not use drugs.   Physical Exam: BP 95/62   Pulse 89  Ht 5' 1" (1.549 m)   Wt 114 lb (51.7 kg)   BMI 21.54 kg/m   Constitutional:  Alert and oriented, No acute distress. HEENT: [place] AT Respiratory: Normal respiratory effort, no increased work of breathing. Psychiatric: Normal mood and affect.   Pertinent Imaging: CT images were personally reviewed and interpreted  CT Renal Stone Study  Narrative CLINICAL DATA:  Left flank pain.  Vomiting.  EXAM: CT ABDOMEN AND PELVIS WITHOUT CONTRAST  TECHNIQUE: Multidetector CT imaging of the abdomen and pelvis was performed following the standard protocol without IV  contrast.  RADIATION DOSE REDUCTION: This exam was performed according to the departmental dose-optimization program which includes automated exposure control, adjustment of the mA and/or kV according to patient size and/or use of iterative reconstruction technique.  COMPARISON:  None Available.  FINDINGS: Lower chest: Included lung bases are clear.  Hepatobiliary: Uppermost stone of the liver is not included in the field of view. Focal liver abnormality on this unenhanced exam. Cholecystectomy, without biliary dilatation.  Pancreas: Grossly negative, although poorly assessed on this unenhanced exam.  Spleen: Normal in size without focal abnormality.  Adrenals/Urinary Tract: Normal adrenal glands. There is mild left hydroureteronephrosis. 4 mm calcification in the left pelvis may represent a distal ureteric stone, although the ureter is poorly defined in the pelvis. No right hydronephrosis. There is no significant perinephric edema. No definite intrarenal calculi. Urinary bladder is empty and not well assessed.  Stomach/Bowel: Detailed bowel assessment is limited in the absence of enteric contrast. The stomach is partially distended, unremarkable. Occasional fluid-filled small bowel without obstruction or obvious inflammation. Low lying cecum in the deep pelvis. Normal appendix. Moderate colonic stool burden.  Vascular/Lymphatic: Abdominal aorta is normal in caliber. There is no bulky abdominopelvic adenopathy.  Reproductive: The uterus is anteverted. Adnexal structures are poorly defined. Small amount of pelvic free fluid.  Other: Small amount of free fluid in the pelvis. No upper abdominal ascites. No free air. Tiny fat containing umbilical hernia.  Musculoskeletal: There are no acute or suspicious osseous abnormalities.  IMPRESSION: Mild left hydroureteronephrosis. 4 mm calcification in the left pelvis may represent a distal ureteric stone, although the ureter  is poorly defined in the pelvis.   Electronically Signed By: Melanie  Sanford M.D. On: 05/14/2022 23:50   Assessment & Plan:    1.  Left ureteral calculus We discussed various treatment options for urolithiasis including observation with or without medical expulsive therapy, shockwave lithotripsy (SWL), ureteroscopy and laser lithotripsy with stent placement. We discussed that management is based on stone size, location, density, patient co-morbidities, and patient preference.  Stones <5mm in size have a >80% spontaneous passage rate. Data surrounding the use of tamsulosin for medical expulsive therapy is controversial, but meta analyses suggests it is most efficacious for distal stones between 5-10mm in size.  SWL has a lower stone free rate in a single procedure, but also a lower complication rate compared to ureteroscopy and avoids a stent and associated stent related symptoms. Possible complications include renal hematoma, steinstrasse, and need for additional treatment. Ureteroscopy with laser lithotripsy and stent placement has a higher stone free rate than SWL in a single procedure, however increased complication rate including possible infection, ureteral injury, bleeding, and stent related morbidity. Common stent related symptoms include dysuria, urgency/frequency, and flank pain. After an discussion of the above treatment options she is leaning towards MET versus SWL and would like to think over. A KUB was ordered to see if the calculus is visualized on plain   x-ray   Scott C Stoioff, MD  [facility] [address], [address] [place], [place] [address] [phone]  

## 2022-05-24 NOTE — Progress Notes (Signed)
05/24/2022 8:34 AM   Amanda Torres 11-11-1986 [id number]  Referring provider: Hilton Sinclair, PA-C [address] [place],  [place] [address]  Chief Complaint  Patient presents with   Nephrolithiasis    HPI: Amanda Torres is a 36 y.o. female who presents in follow-up of recent ED visit for renal colic.  Presented to [facility] ED 05/15/2022 with severe left flank pain radiating to the left lower quadrant.  Had noted intermittent mild pain in the abdomen/pelvis for 2 weeks prior At the time of ED visit she did have nausea/vomiting.  Denied fever or chills.  Urinalysis showed >50 RBCs/6-10 WBCs.  Bacteria are present in the 11-20 squamous epithelial cells.  She was started on empiric antibiotics.  Urine culture grew multiple species. Stone protocol CT showed mild left hydronephrosis/hydroureter and a probable 4 mm left distal ureteral calculus though the ureter was not clearly defined She received ketorolac, antiemetics and Rocephin.  Was discharged on tamsulosin, Duricef and oxycodone. Since her ED visit she has noted significant improvement in her pain.  She is presently having mild pelvic pressure No prior history of stone disease  PMH: Past Medical History:  Diagnosis Date   Medical history non-contributory     Surgical History: Past Surgical History:  Procedure Laterality Date   CHOLECYSTECTOMY     TONSILLECTOMY      Home Medications:  Allergies as of 05/24/2022       Reactions   Lactose Diarrhea   Influenza Virus Vaccine Rash   Meningococcal Polysaccharide Vaccine Acwy Rash        Medication List        Accurate as of May 24, 2022  8:34 AM. If you have any questions, ask your nurse or doctor.          acetaminophen 325 MG tablet Commonly known as: TYLENOL Take 650 mg by mouth every 6 (six) hours as needed.   albuterol 108 (90 Base) MCG/ACT inhaler Commonly known as: VENTOLIN HFA Inhale 2 puffs into the lungs every 6 (six) hours as needed for  wheezing or shortness of breath.   benzonatate 100 MG capsule Commonly known as: TESSALON Take 1 capsule (100 mg total) by mouth 3 (three) times daily as needed.   docusate sodium 100 MG capsule Commonly known as: Colace Take 1 tablet once or twice daily as needed for constipation while taking narcotic pain medicine   ibuprofen 100 MG tablet Commonly known as: ADVIL Take 100 mg by mouth every 6 (six) hours as needed for fever.   ondansetron 4 MG disintegrating tablet Commonly known as: ZOFRAN-ODT Allow 1-2 tablets to dissolve in your mouth every 8 hours as needed for nausea/vomiting   oxyCODONE-acetaminophen 5-325 MG tablet Commonly known as: Percocet Take 2 tablets by mouth every 6 (six) hours as needed for severe pain.   tamsulosin 0.4 MG Caps capsule Commonly known as: FLOMAX Take 1 tablet by mouth daily until you pass the kidney stone or no longer have symptoms        Allergies:  Allergies  Allergen Reactions   Lactose Diarrhea   Influenza Virus Vaccine Rash   Meningococcal Polysaccharide Vaccine Acwy Rash    Family History: Family History  Problem Relation Age of Onset   Lung disease Mother     Social History:  reports that she has never smoked. She has never used smokeless tobacco. She reports that she does not drink alcohol and does not use drugs.   Physical Exam: BP 95/62   Pulse 89  Ht 5' 1" (1.549 m)   Wt 114 lb (51.7 kg)   BMI 21.54 kg/m   Constitutional:  Alert and oriented, No acute distress. HEENT: [place] AT Respiratory: Normal respiratory effort, no increased work of breathing. Psychiatric: Normal mood and affect.   Pertinent Imaging: CT images were personally reviewed and interpreted  CT Renal Stone Study  Narrative CLINICAL DATA:  Left flank pain.  Vomiting.  EXAM: CT ABDOMEN AND PELVIS WITHOUT CONTRAST  TECHNIQUE: Multidetector CT imaging of the abdomen and pelvis was performed following the standard protocol without IV  contrast.  RADIATION DOSE REDUCTION: This exam was performed according to the departmental dose-optimization program which includes automated exposure control, adjustment of the mA and/or kV according to patient size and/or use of iterative reconstruction technique.  COMPARISON:  None Available.  FINDINGS: Lower chest: Included lung bases are clear.  Hepatobiliary: Uppermost stone of the liver is not included in the field of view. Focal liver abnormality on this unenhanced exam. Cholecystectomy, without biliary dilatation.  Pancreas: Grossly negative, although poorly assessed on this unenhanced exam.  Spleen: Normal in size without focal abnormality.  Adrenals/Urinary Tract: Normal adrenal glands. There is mild left hydroureteronephrosis. 4 mm calcification in the left pelvis may represent a distal ureteric stone, although the ureter is poorly defined in the pelvis. No right hydronephrosis. There is no significant perinephric edema. No definite intrarenal calculi. Urinary bladder is empty and not well assessed.  Stomach/Bowel: Detailed bowel assessment is limited in the absence of enteric contrast. The stomach is partially distended, unremarkable. Occasional fluid-filled small bowel without obstruction or obvious inflammation. Low lying cecum in the deep pelvis. Normal appendix. Moderate colonic stool burden.  Vascular/Lymphatic: Abdominal aorta is normal in caliber. There is no bulky abdominopelvic adenopathy.  Reproductive: The uterus is anteverted. Adnexal structures are poorly defined. Small amount of pelvic free fluid.  Other: Small amount of free fluid in the pelvis. No upper abdominal ascites. No free air. Tiny fat containing umbilical hernia.  Musculoskeletal: There are no acute or suspicious osseous abnormalities.  IMPRESSION: Mild left hydroureteronephrosis. 4 mm calcification in the left pelvis may represent a distal ureteric stone, although the ureter  is poorly defined in the pelvis.   Electronically Signed By: Melanie  Sanford M.D. On: 05/14/2022 23:50   Assessment & Plan:    1.  Left ureteral calculus We discussed various treatment options for urolithiasis including observation with or without medical expulsive therapy, shockwave lithotripsy (SWL), ureteroscopy and laser lithotripsy with stent placement. We discussed that management is based on stone size, location, density, patient co-morbidities, and patient preference.  Stones <5mm in size have a >80% spontaneous passage rate. Data surrounding the use of tamsulosin for medical expulsive therapy is controversial, but meta analyses suggests it is most efficacious for distal stones between 5-10mm in size.  SWL has a lower stone free rate in a single procedure, but also a lower complication rate compared to ureteroscopy and avoids a stent and associated stent related symptoms. Possible complications include renal hematoma, steinstrasse, and need for additional treatment. Ureteroscopy with laser lithotripsy and stent placement has a higher stone free rate than SWL in a single procedure, however increased complication rate including possible infection, ureteral injury, bleeding, and stent related morbidity. Common stent related symptoms include dysuria, urgency/frequency, and flank pain. After an discussion of the above treatment options she is leaning towards MET versus SWL and would like to think over. A KUB was ordered to see if the calculus is visualized on plain   x-ray   Murlean Seelye C Deina Lipsey, MD  [facility] [address], [address] [place], [place] [address] [phone]  

## 2022-05-25 ENCOUNTER — Encounter: Payer: Self-pay | Admitting: Urology

## 2022-05-26 ENCOUNTER — Other Ambulatory Visit: Payer: Self-pay

## 2022-05-26 ENCOUNTER — Other Ambulatory Visit: Payer: Self-pay | Admitting: Urology

## 2022-05-26 DIAGNOSIS — N201 Calculus of ureter: Secondary | ICD-10-CM

## 2022-05-26 NOTE — Progress Notes (Signed)
ESWL ORDER FORM  Expected date of procedure: 05/27/2022  Surgeon: John Giovanni, MD  Post op standing: 2-4wk follow up w/KUB prior  Anticoagulation/Aspirin/NSAID standing order: Hold all 72 hours prior  Anesthesia standing order: MAC  VTE standing: SCD's  Dx: Left Ureteral Stone  Procedure: left Extracorporeal shock wave lithotripsy  CPT : 00174  Standing Order Set:   *NPO after mn, KUB  *NS 168m/hr, Keflex 5038mPO, Benadryl 2571mO, Valium 72m25m, Zofran 4mg 63m   Medications if other than standing orders:   NONE

## 2022-05-26 NOTE — Progress Notes (Signed)
ESWL ORDER FORM  Expected date of procedure: 05/27/2022  Surgeon: John Giovanni, MD  Post op standing: 2-4wk follow up w/KUB prior  Anticoagulation/Aspirin/NSAID standing order: Hold all 72 hours prior  Anesthesia standing order: MAC  VTE standing: SCD's  Dx: Left Ureteral Stone  Procedure: left Extracorporeal shock wave lithotripsy  CPT : 29021  Standing Order Set:   *NPO after mn, KUB  *NS 113m/hr, Keflex 5062mPO, Benadryl 2528mO, Valium 36m58m, Zofran 4mg 81m   Medications if other than standing orders:   NONE

## 2022-05-27 ENCOUNTER — Encounter
Admission: RE | Disposition: A | Discharge status: Home / Self Care | Payer: Self-pay | Source: Home / Self Care | Attending: Urology

## 2022-05-27 ENCOUNTER — Ambulatory Visit: Payer: BC Managed Care – PPO

## 2022-05-27 ENCOUNTER — Ambulatory Visit
Admission: RE | Admit: 2022-05-27 | Discharge: 2022-05-27 | Disposition: A | Discharge status: Home / Self Care | Payer: BC Managed Care – PPO | Attending: Urology | Admitting: Urology

## 2022-05-27 ENCOUNTER — Encounter: Payer: Self-pay | Admitting: Urology

## 2022-05-27 ENCOUNTER — Other Ambulatory Visit: Payer: Self-pay

## 2022-05-27 DIAGNOSIS — N201 Calculus of ureter: Secondary | ICD-10-CM | POA: Diagnosis present

## 2022-05-27 HISTORY — PX: EXTRACORPOREAL SHOCK WAVE LITHOTRIPSY: SHX1557

## 2022-05-27 LAB — POCT PREGNANCY, URINE: Preg Test, Ur: NEGATIVE

## 2022-05-27 SURGERY — LITHOTRIPSY, ESWL
Anesthesia: Moderate Sedation | Laterality: Left

## 2022-05-27 MED ORDER — CEPHALEXIN 500 MG PO CAPS
ORAL_CAPSULE | ORAL | Status: AC
  Administered 2022-05-27: 500 mg via ORAL
  Filled 2022-05-27: qty 1

## 2022-05-27 MED ORDER — ONDANSETRON HCL 4 MG/2ML IJ SOLN: 4.0000 mg | Freq: Once | INTRAMUSCULAR | Status: AC

## 2022-05-27 MED ORDER — ONDANSETRON HCL 4 MG/2ML IJ SOLN
INTRAMUSCULAR | Status: AC
  Administered 2022-05-27: 4 mg via INTRAVENOUS
  Filled 2022-05-27: qty 2

## 2022-05-27 MED ORDER — DIPHENHYDRAMINE HCL 25 MG PO CAPS
ORAL_CAPSULE | ORAL | Status: AC
  Administered 2022-05-27: 25 mg via ORAL
  Filled 2022-05-27: qty 1

## 2022-05-27 MED ORDER — DIPHENHYDRAMINE HCL 25 MG PO CAPS: 25.0000 mg | ORAL_CAPSULE | ORAL | Status: AC

## 2022-05-27 MED ORDER — DIAZEPAM 5 MG PO TABS: 10.0000 mg | ORAL_TABLET | ORAL | Status: AC

## 2022-05-27 MED ORDER — DIAZEPAM 5 MG PO TABS
ORAL_TABLET | ORAL | Status: AC
  Administered 2022-05-27: 10 mg via ORAL
  Filled 2022-05-27: qty 2

## 2022-05-27 MED ORDER — SODIUM CHLORIDE 0.9 % IV SOLN: INTRAVENOUS | Status: DC

## 2022-05-27 MED ORDER — CEPHALEXIN 500 MG PO CAPS: 500.0000 mg | ORAL_CAPSULE | Freq: Once | ORAL | Status: AC

## 2022-05-27 MED ORDER — TAMSULOSIN HCL 0.4 MG PO CAPS: ORAL_CAPSULE | ORAL | 0 refills | Status: DC

## 2022-05-27 NOTE — Discharge Instructions (Signed)
As per the [facility] discharge instructions Continue pain medication and nausea as needed Continue tamsulosin which will help you pass stone fragments was also sent to your pharmacy Call [facility] at [phone] for pain not controlled with oral medications or fever greater than 101 degrees You have a follow-up appointment scheduled 06/24/2022

## 2022-05-27 NOTE — Interval H&P Note (Signed)
History and Physical Interval Note:  CV:RRR Lungs: Clear  05/27/2022 9:34 AM  Amanda Torres  has presented today for surgery, with the diagnosis of Left Ureteral Stone.  The various methods of treatment have been discussed with the patient and family. After consideration of risks, benefits and other options for treatment, the patient has consented to  Procedure(s): EXTRACORPOREAL SHOCK WAVE LITHOTRIPSY (ESWL) (Left) as a surgical intervention.  The patient's history has been reviewed, patient examined, no change in status, stable for surgery.  I have reviewed the patient's chart and labs.  Questions were answered to the patient's satisfaction.     Aaronmichael Brumbaugh C Rilda Bulls

## 2022-05-28 ENCOUNTER — Encounter: Payer: Self-pay | Admitting: Urology

## 2022-06-24 ENCOUNTER — Ambulatory Visit
Admission: RE | Admit: 2022-06-24 | Discharge: 2022-06-24 | Disposition: A | Discharge status: Home / Self Care | Payer: BC Managed Care – PPO | Source: Ambulatory Visit | Attending: Urology | Admitting: Urology

## 2022-06-24 ENCOUNTER — Ambulatory Visit: Payer: BC Managed Care – PPO | Admitting: Physician Assistant

## 2022-06-24 ENCOUNTER — Ambulatory Visit
Admission: RE | Admit: 2022-06-24 | Discharge: 2022-06-24 | Disposition: A | Discharge status: Home / Self Care | Payer: BC Managed Care – PPO | Attending: Urology | Admitting: Urology

## 2022-06-24 VITALS — BP 108/74 | HR 68 | Ht 61.0 in | Wt 117.0 lb

## 2022-06-24 DIAGNOSIS — N201 Calculus of ureter: Secondary | ICD-10-CM

## 2022-06-24 LAB — MICROSCOPIC EXAMINATION: Epithelial Cells (non renal): >10 /hpf — AB (ref 0–10)

## 2022-06-24 LAB — URINALYSIS, COMPLETE
Bilirubin, UA: NEGATIVE
Glucose, UA: NEGATIVE
Ketones, UA: NEGATIVE
Leukocytes,UA: NEGATIVE
Nitrite, UA: NEGATIVE
RBC, UA: NEGATIVE
Specific Gravity, UA: 1.02 (ref 1.005–1.030)
Urobilinogen, Ur: 0.2 mg/dL (ref 0.2–1.0)
pH, UA: 8.5 — ABNORMAL HIGH (ref 5.0–7.5)

## 2022-06-24 NOTE — Progress Notes (Signed)
06/24/2022 10:39 AM   Talasia Remo Lipps 1986-01-22 [id number]  CC: Chief Complaint  Patient presents with   Nephrolithiasis   HPI: Amanda Torres is a 36 y.o. female who underwent ESWL with Dr. Lonna Cobb on 05/27/2022 for management of a 4 mm distal left ureteral stone who presents today for postop follow-up.  Procedure note describes smudging of the stone.  Today she reports she passed a significant amount of blood and debris immediately following her procedure and did not see any stones pass thereafter.  Her symptoms have completely resolved.  KUB today with interval clearance of the distal left ureteral stone.  No other renal stones on recent CT stone study.  This was her first stone episode.  Her only family history of nephrolithiasis is in a maternal aunt, who had 1 kidney stone.  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  She frequently drinks smoothies containing almond milk and spinach.  In-office UA today positive for trace protein; urine microscopy with >10 epithelial cells/hpf, granular casts, and many bacteria.    PMH: Past Medical History:  Diagnosis Date   Medical history non-contributory     Surgical History: Past Surgical History:  Procedure Laterality Date   CHOLECYSTECTOMY     EXTRACORPOREAL SHOCK WAVE LITHOTRIPSY Left 05/27/2022   Procedure: EXTRACORPOREAL SHOCK WAVE LITHOTRIPSY (ESWL);  Surgeon: Riki Altes, MD;  Location: [facility] ORS;  Service: Urology;  Laterality: Left;   TONSILLECTOMY      Home Medications:  Allergies as of 06/24/2022       Reactions   Levonorgestrel-ethinyl Estrad    Other reaction(s): Headache, Other (See Comments)   Lactose Diarrhea   Influenza Virus Vaccine Rash   Meningococcal Polysaccharide Vaccine Acwy Rash        Medication List        Accurate as of June 24, 2022 10:39 AM. If you have any questions, ask your nurse or doctor.          acetaminophen 325 MG  tablet Commonly known as: TYLENOL Take 650 mg by mouth every 6 (six) hours as needed.   albuterol 108 (90 Base) MCG/ACT inhaler Commonly known as: VENTOLIN HFA Inhale 2 puffs into the lungs every 6 (six) hours as needed for wheezing or shortness of breath.   benzonatate 100 MG capsule Commonly known as: TESSALON Take 1 capsule (100 mg total) by mouth 3 (three) times daily as needed.   docusate sodium 100 MG capsule Commonly known as: Colace Take 1 tablet once or twice daily as needed for constipation while taking narcotic pain medicine   FLUoxetine 20 MG capsule Commonly known as: PROZAC Take 20 mg by mouth daily.   ibuprofen 100 MG tablet Commonly known as: ADVIL Take 100 mg by mouth every 6 (six) hours as needed for fever.   ondansetron 4 MG disintegrating tablet Commonly known as: ZOFRAN-ODT Allow 1-2 tablets to dissolve in your mouth every 8 hours as needed for nausea/vomiting   oxyCODONE-acetaminophen 5-325 MG tablet Commonly known as: Percocet Take 2 tablets by mouth every 6 (six) hours as needed for severe pain.   tamsulosin 0.4 MG Caps capsule Commonly known as: FLOMAX Take 1 tablet by mouth daily until you pass the kidney stone or no longer have symptoms        Allergies:  Allergies  Allergen Reactions   Levonorgestrel-Ethinyl Estrad     Other reaction(s): Headache, Other (See Comments)   Lactose Diarrhea   Influenza Virus Vaccine Rash  Meningococcal Polysaccharide Vaccine Acwy Rash    Family History: Family History  Problem Relation Age of Onset   Lung disease Mother     Social History:   reports that she has never smoked. She has never used smokeless tobacco. She reports that she does not drink alcohol and does not use drugs.  Physical Exam: BP 108/74   Pulse 68   Ht 5\' 1"  (1.549 m)   Wt 117 lb (53.1 kg)   LMP 05/07/2022   BMI 22.11 kg/m   Constitutional:  Alert and oriented, no acute distress, nontoxic appearing HEENT: [place],  AT Cardiovascular: No clubbing, cyanosis, or edema Respiratory: Normal respiratory effort, no increased work of breathing Skin: No rashes, bruises or suspicious lesions Neurologic: Grossly intact, no focal deficits, moving all 4 extremities Psychiatric: Normal mood and affect  Laboratory Data: Results for orders placed or performed in visit on 06/24/22  Microscopic Examination   Urine  Result Value Ref Range   WBC, UA 0-5 0 - 5 /hpf   RBC, Urine 0-2 0 - 2 /hpf   Epithelial Cells (non renal) >10 (A) 0 - 10 /hpf   Casts Present (A) None seen /lpf   Cast Type Granular casts (A) N/A   Bacteria, UA Many (A) None seen/Few  Urinalysis, Complete  Result Value Ref Range   Specific Gravity, UA 1.020 1.005 - 1.030   pH, UA 8.5 (H) 5.0 - 7.5   Color, UA Yellow Yellow   Appearance Ur Clear Clear   Leukocytes,UA Negative Negative   Protein,UA Trace (A) Negative/Trace   Glucose, UA Negative Negative   Ketones, UA Negative Negative   RBC, UA Negative Negative   Bilirubin, UA Negative Negative   Urobilinogen, Ur 0.2 0.2 - 1.0 mg/dL   Nitrite, UA Negative Negative   Microscopic Examination See below:    Pertinent Imaging: KUB, 06/24/2022: CLINICAL DATA:  Lithotripsy 4 weeks ago   EXAM: ABDOMEN - 1 VIEW   COMPARISON:  05/27/2022   FINDINGS: Supine frontal view of the abdomen and pelvis was obtained, excluding the hemidiaphragms by collimation. Bowel gas pattern is unremarkable without obstruction or ileus. There are no abdominal masses or abnormal calcifications. Specifically, no evidence of radiopaque urinary tract calculi. The distal left ureteral calculus seen previously has been removed or has passed in the interim. No acute bony abnormalities.   IMPRESSION: 1. Interval passage or removal of the distal left ureteral calculus seen previously. No radiopaque calculi on this exam.     Electronically Signed   By: 05/29/2022 M.D.   On: 06/24/2022 16:01  I personally  reviewed the images referenced above and note interval resolution of the distal left ureteral stone.  Assessment & Plan:   1. Left ureteral calculus Plan resolved on KUB and she is asymptomatic.  I offered her renal ultrasound to evaluate for any left hydronephrosis that would be suggestive of retained fragment, but she declined this, which is reasonable.  We discussed stone prevention recommendations today since there are no fragments to send for analysis.  Specifically, we discussed increasing hydration, decreasing oxalate intake, maintaining moderate calcium intake, decreasing sodium intake, and increasing citrate intake.  Printed references provided.  I also offered her a metabolic workup, however given this is her first stone and there is no evidence of other stones on imaging, she elected to defer this, which is also reasonable.  Will plan to see her back in 1 year with KUB prior.  If KUB is negative at that time,  okay to follow-up as needed. - Urinalysis, Complete   Return for Annual stone visit with KUB prior.  Carman Ching, PA-C  [facility] [address], [address] [place], [place] [address] [phone]

## 2023-06-27 ENCOUNTER — Other Ambulatory Visit: Payer: Self-pay

## 2023-06-27 DIAGNOSIS — N201 Calculus of ureter: Secondary | ICD-10-CM

## 2023-06-28 ENCOUNTER — Ambulatory Visit: Payer: BC Managed Care – PPO | Admitting: Physician Assistant

## 2023-07-20 ENCOUNTER — Ambulatory Visit
Admission: RE | Admit: 2023-07-20 | Discharge: 2023-07-20 | Disposition: A | Discharge status: Home / Self Care | Payer: BC Managed Care – PPO | Attending: Physician Assistant | Admitting: Physician Assistant

## 2023-07-20 ENCOUNTER — Encounter: Payer: Self-pay | Admitting: Physician Assistant

## 2023-07-20 ENCOUNTER — Ambulatory Visit: Payer: BC Managed Care – PPO | Admitting: Physician Assistant

## 2023-07-20 ENCOUNTER — Ambulatory Visit
Admission: RE | Admit: 2023-07-20 | Discharge: 2023-07-20 | Disposition: A | Discharge status: Home / Self Care | Payer: BC Managed Care – PPO | Source: Ambulatory Visit | Attending: Physician Assistant | Admitting: Physician Assistant

## 2023-07-20 VITALS — BP 98/64 | HR 75 | Wt 123.0 lb

## 2023-07-20 DIAGNOSIS — Z09 Encounter for follow-up examination after completed treatment for conditions other than malignant neoplasm: Secondary | ICD-10-CM

## 2023-07-20 DIAGNOSIS — Z87442 Personal history of urinary calculi: Secondary | ICD-10-CM

## 2023-07-20 DIAGNOSIS — N201 Calculus of ureter: Secondary | ICD-10-CM | POA: Diagnosis present

## 2023-07-20 NOTE — Progress Notes (Signed)
07/20/2023 10:15 AM   Amanda Torres 10-07-1986 [id number]  CC: Chief Complaint  Patient presents with   KUB   HPI: Amanda Torres is a 37 y.o. female with PMH nephrolithiasis s/p ESWL in 2023 who presents today for annual follow-up.   Today she reports no stone episodes, flank pain, or gross hematuria over the past year.  She feels well with no acute concerns.  KUB today with no radiopaque nephrolithiasis.  PMH: Past Medical History:  Diagnosis Date   Medical history non-contributory     Surgical History: Past Surgical History:  Procedure Laterality Date   CHOLECYSTECTOMY     EXTRACORPOREAL SHOCK WAVE LITHOTRIPSY Left 05/27/2022   Procedure: EXTRACORPOREAL SHOCK WAVE LITHOTRIPSY (ESWL);  Surgeon: Riki Altes, MD;  Location: [facility] ORS;  Service: Urology;  Laterality: Left;   TONSILLECTOMY      Home Medications:  Allergies as of 07/20/2023       Reactions   Levonorgestrel-ethinyl Estrad    Other reaction(s): Headache, Other (See Comments)   Lactose Diarrhea   Influenza Virus Vaccine Rash   Meningococcal Polysaccharide Vaccine Acwy Rash        Medication List        Accurate as of July 20, 2023 10:15 AM. If you have any questions, ask your nurse or doctor.          STOP taking these medications    albuterol 108 (90 Base) MCG/ACT inhaler Commonly known as: VENTOLIN HFA Stopped by: Carman Ching   benzonatate 100 MG capsule Commonly known as: TESSALON Stopped by: Carman Ching   docusate sodium 100 MG capsule Commonly known as: Colace Stopped by: Lelon Mast Galadriel Shroff   ondansetron 4 MG disintegrating tablet Commonly known as: ZOFRAN-ODT Stopped by: Carman Ching   oxyCODONE-acetaminophen 5-325 MG tablet Commonly known as: Percocet Stopped by: Carman Ching   tamsulosin 0.4 MG Caps capsule Commonly known as: FLOMAX Stopped by: Carman Ching       TAKE these medications     acetaminophen 325 MG tablet Commonly known as: TYLENOL Take 650 mg by mouth every 6 (six) hours as needed.   FLUoxetine 20 MG capsule Commonly known as: PROZAC Take 20 mg by mouth daily.   ibuprofen 100 MG tablet Commonly known as: ADVIL Take 100 mg by mouth every 6 (six) hours as needed for fever.        Allergies:  Allergies  Allergen Reactions   Levonorgestrel-Ethinyl Estrad     Other reaction(s): Headache, Other (See Comments)   Lactose Diarrhea   Influenza Virus Vaccine Rash   Meningococcal Polysaccharide Vaccine Acwy Rash    Family History: Family History  Problem Relation Age of Onset   Lung disease Mother     Social History:   reports that she has never smoked. She has never used smokeless tobacco. She reports that she does not drink alcohol and does not use drugs.  Physical Exam: BP 98/64   Pulse 75   Wt 123 lb (55.8 kg)   LMP 07/12/2023   BMI 23.24 kg/m   Constitutional:  Alert and oriented, no acute distress, nontoxic appearing HEENT: , AT Cardiovascular: No clubbing, cyanosis, or edema Respiratory: Normal respiratory effort, no increased work of breathing Skin: No rashes, bruises or suspicious lesions Neurologic: Grossly intact, no focal deficits, moving all 4 extremities Psychiatric: Normal mood and affect  Pertinent Imaging: KUB, 07/20/2023: CLINICAL DATA:  Abdominal pain.   EXAM: ABDOMEN - 1 VIEW   COMPARISON:  06/24/2022.   FINDINGS: The bowel gas  pattern is normal without gaseous distention. No radio-opaque calculi or other significant radiographic abnormality are seen. Increased stool noted consistent with constipation.   IMPRESSION: Constipation.     Electronically Signed   By: Layla Maw M.D.   On: 07/24/2023 14:19  I personally reviewed the images referenced above and note no radiopaque nephrolithiasis.  Assessment & Plan:   1. History of nephrolithiasis Asymptomatic over the past year.  No evidence of new  stones on KUB today.  We discussed continuing annual follow-up versus following up as needed moving forward and she prefers the latter, which is reasonable.  Return if symptoms worsen or fail to improve.  Carman Ching, PA-C  [facility] [place] [address], [address] [place], [place] [address] [phone]

## 2023-08-24 ENCOUNTER — Other Ambulatory Visit: Payer: Self-pay | Admitting: Nurse Practitioner

## 2023-08-24 DIAGNOSIS — E230 Hypopituitarism: Secondary | ICD-10-CM

## 2023-08-24 DIAGNOSIS — R7989 Other specified abnormal findings of blood chemistry: Secondary | ICD-10-CM

## 2023-09-01 ENCOUNTER — Ambulatory Visit: Payer: BC Managed Care – PPO

## 2023-09-12 ENCOUNTER — Ambulatory Visit: Payer: BC Managed Care – PPO

## 2023-09-22 ENCOUNTER — Other Ambulatory Visit: Payer: Self-pay | Admitting: Nurse Practitioner

## 2023-09-22 DIAGNOSIS — E230 Hypopituitarism: Secondary | ICD-10-CM

## 2023-09-22 DIAGNOSIS — R7989 Other specified abnormal findings of blood chemistry: Secondary | ICD-10-CM

## 2024-07-04 ENCOUNTER — Other Ambulatory Visit: Payer: Self-pay | Admitting: Medical Genetics

## 2024-07-05 ENCOUNTER — Other Ambulatory Visit
Admission: RE | Admit: 2024-07-05 | Discharge: 2024-07-05 | Disposition: A | Discharge status: Home / Self Care | Payer: Self-pay | Source: Ambulatory Visit | Attending: Medical Genetics | Admitting: Medical Genetics

## 2024-07-15 LAB — GENECONNECT MOLECULAR SCREEN: Genetic Analysis Overall Interpretation: NEGATIVE
